# Patient Record
Sex: Female | Born: 1985 | Race: Black or African American | Hispanic: No | Marital: Single | Smoking: Current every day smoker
Health system: Southern US, Community
[De-identification: ages and names within clinical notes are randomized; demographics above are authoritative.]

## PROBLEM LIST (undated history)

## (undated) DIAGNOSIS — N871 Moderate cervical dysplasia: Secondary | ICD-10-CM

## (undated) HISTORY — DX: Moderate cervical dysplasia: N87.1

---

## 2012-02-20 ENCOUNTER — Encounter (HOSPITAL_BASED_OUTPATIENT_CLINIC_OR_DEPARTMENT_OTHER): Payer: Self-pay | Admitting: Emergency Medicine

## 2012-02-20 ENCOUNTER — Emergency Department (HOSPITAL_BASED_OUTPATIENT_CLINIC_OR_DEPARTMENT_OTHER)
Admission: EM | Admit: 2012-02-20 | Discharge: 2012-02-20 | Disposition: A | Discharge status: Home / Self Care | Payer: Self-pay | Attending: Emergency Medicine | Admitting: Emergency Medicine

## 2012-02-20 DIAGNOSIS — B373 Candidiasis of vulva and vagina: Secondary | ICD-10-CM | POA: Insufficient documentation

## 2012-02-20 DIAGNOSIS — B3731 Acute candidiasis of vulva and vagina: Secondary | ICD-10-CM | POA: Insufficient documentation

## 2012-02-20 DIAGNOSIS — N760 Acute vaginitis: Secondary | ICD-10-CM | POA: Insufficient documentation

## 2012-02-20 DIAGNOSIS — B9689 Other specified bacterial agents as the cause of diseases classified elsewhere: Secondary | ICD-10-CM

## 2012-02-20 DIAGNOSIS — F17200 Nicotine dependence, unspecified, uncomplicated: Secondary | ICD-10-CM | POA: Insufficient documentation

## 2012-02-20 DIAGNOSIS — N898 Other specified noninflammatory disorders of vagina: Secondary | ICD-10-CM | POA: Insufficient documentation

## 2012-02-20 LAB — WET PREP, GENITAL: Trich, Wet Prep: NONE SEEN

## 2012-02-20 LAB — URINALYSIS, ROUTINE W REFLEX MICROSCOPIC
Ketones, ur: 15 mg/dL — AB
Nitrite: NEGATIVE
Protein, ur: NEGATIVE mg/dL
pH: 6 (ref 5.0–8.0)

## 2012-02-20 LAB — URINE MICROSCOPIC-ADD ON

## 2012-02-20 MED ORDER — FLUCONAZOLE 150 MG PO TABS: 150.0000 mg | ORAL_TABLET | Freq: Once | ORAL | Status: DC

## 2012-02-20 MED ORDER — CEFTRIAXONE SODIUM 250 MG IJ SOLR
250.0000 mg | Freq: Once | INTRAMUSCULAR | Status: AC
  Administered 2012-02-20: 250 mg via INTRAMUSCULAR
  Filled 2012-02-20: qty 250

## 2012-02-20 MED ORDER — LIDOCAINE HCL (PF) 1 % IJ SOLN
INTRAMUSCULAR | Status: AC
  Administered 2012-02-20: 20:00:00
  Filled 2012-02-20: qty 5

## 2012-02-20 MED ORDER — AZITHROMYCIN 250 MG PO TABS
1000.0000 mg | ORAL_TABLET | Freq: Once | ORAL | Status: AC
  Administered 2012-02-20: 1000 mg via ORAL
  Filled 2012-02-20: qty 4

## 2012-02-20 MED ORDER — METRONIDAZOLE 500 MG PO TABS: 500.0000 mg | ORAL_TABLET | Freq: Two times a day (BID) | ORAL | Status: DC

## 2012-02-20 NOTE — ED Provider Notes (Signed)
Medical screening examination/treatment/procedure(s) were performed by non-physician practitioner and as supervising physician I was immediately available for consultation/collaboration.   Jackie Russman, MD 02/20/12 2312 

## 2012-02-20 NOTE — ED Provider Notes (Signed)
History     CSN: [id number]  Arrival date & time 02/20/12  7829   First MD Initiated Contact with Patient 02/20/12 1830      Chief Complaint  Patient presents with  . Vaginal Itching  . Vaginal Discharge    (Consider location/radiation/quality/duration/timing/severity/associated sxs/prior treatment) Patient is a 26 y.o. female presenting with vaginal discharge. The history is provided by the patient. No language interpreter was used.  Vaginal Discharge This is a new problem. The current episode started today. The problem occurs constantly. The problem has been gradually worsening. Nothing aggravates the symptoms. She has tried nothing for the symptoms. The treatment provided moderate relief.  Pt complains of a vaginal discharge.   Pt reports she had chlamydia a month ago  History reviewed. No pertinent past medical history.  History reviewed. No pertinent past surgical history.  No family history on file.  History  Substance Use Topics  . Smoking status: Current Every Day Smoker  . Smokeless tobacco: Not on file  . Alcohol Use: Yes     Comment: occ    OB History    Grav Para Term Preterm Abortions TAB SAB Ect Mult Living                  Review of Systems  Genitourinary: Positive for vaginal discharge.  All other systems reviewed and are negative.    Allergies  Review of patient's allergies indicates no known allergies.  Home Medications  No current outpatient prescriptions on file.  BP 143/97  Pulse 89  Temp 99.2 F (37.3 C) (Oral)  Resp 16  Ht 5\' 2"  (1.575 m)  Wt 120 lb (54.432 kg)  BMI 21.95 kg/m2  SpO2 100%  Physical Exam  Nursing note and vitals reviewed. Constitutional: She is oriented to person, place, and time. She appears well-developed and well-nourished.  HENT:  Head: Normocephalic.  Eyes: Pupils are equal, round, and reactive to light.  Neck: Normal range of motion.  Cardiovascular: Normal rate and normal heart sounds.     Pulmonary/Chest: Effort normal.  Abdominal: Soft.  Genitourinary: Vaginal discharge found.  Musculoskeletal: Normal range of motion.       Thick white vaginal discharge  Neurological: She is alert and oriented to person, place, and time. She has normal reflexes.  Skin: Skin is warm.  Psychiatric: She has a normal mood and affect.    ED Course  Procedures (including critical care time)  Labs Reviewed  URINALYSIS, ROUTINE W REFLEX MICROSCOPIC - Abnormal; Notable for the following:    Color, Urine AMBER (*)  BIOCHEMICALS MAY BE AFFECTED BY COLOR   APPearance CLOUDY (*)     Bilirubin Urine SMALL (*)     Ketones, ur 15 (*)     Leukocytes, UA MODERATE (*)     All other components within normal limits  URINE MICROSCOPIC-ADD ON - Abnormal; Notable for the following:    Squamous Epithelial / LPF MANY (*)     Bacteria, UA FEW (*)     All other components within normal limits  PREGNANCY, URINE  URINE CULTURE   No results found.   No diagnosis found.    MDM  Rocephin and zithromax given here.   Pt given rx for flagyl and diflucan.        Lonia Skinner [place], [place] 02/20/12 1956

## 2012-02-20 NOTE — ED Notes (Signed)
Vaginal itching and burning with white foamy discharge x1 week.

## 2012-02-21 ENCOUNTER — Encounter: Payer: Self-pay | Admitting: Obstetrics & Gynecology

## 2012-02-21 LAB — GC/CHLAMYDIA PROBE AMP
CT Probe RNA: NEGATIVE
GC Probe RNA: NEGATIVE

## 2012-02-22 LAB — URINE CULTURE: Colony Count: 30000

## 2012-04-16 ENCOUNTER — Encounter: Payer: Self-pay | Admitting: Obstetrics & Gynecology

## 2012-05-13 ENCOUNTER — Ambulatory Visit (INDEPENDENT_AMBULATORY_CARE_PROVIDER_SITE_OTHER): Payer: Medicaid Other | Admitting: Obstetrics & Gynecology

## 2012-05-13 ENCOUNTER — Other Ambulatory Visit (HOSPITAL_COMMUNITY)
Admission: RE | Admit: 2012-05-13 | Discharge: 2012-05-13 | Disposition: A | Discharge status: Home / Self Care | Payer: Medicaid Other | Source: Ambulatory Visit | Attending: Obstetrics & Gynecology | Admitting: Obstetrics & Gynecology

## 2012-05-13 ENCOUNTER — Encounter: Payer: Self-pay | Admitting: Obstetrics & Gynecology

## 2012-05-13 VITALS — BP 117/80 | HR 99 | Temp 99.5°F | Ht 62.0 in | Wt 125.7 lb

## 2012-05-13 DIAGNOSIS — R87612 Low grade squamous intraepithelial lesion on cytologic smear of cervix (LGSIL): Secondary | ICD-10-CM

## 2012-05-13 DIAGNOSIS — R87619 Unspecified abnormal cytological findings in specimens from cervix uteri: Secondary | ICD-10-CM | POA: Insufficient documentation

## 2012-05-13 NOTE — Patient Instructions (Addendum)
Loop Electrosurgical Excision Procedure Loop electrosurgical excision procedure (LEEP) is the removal of a portion of the lower part of the uterus (cervix). The procedure is done when there are significantly abnormal cervical cell changes. Abnormal cell changes of the cervix can lead to cancer if left in place and untreated.  The LEEP procedure itself typically only takes a few minutes. Often, it may be done in your caregiver's office. The procedure is considered safe for those who wish to get pregnant or are trying to get pregnant. Only under rare circumstances should this procedure be done if you are pregnant. LET YOUR CAREGIVER KNOW ABOUT:  Whether you are pregnant or late for your last menstrual period.  Allergies to foods or medicines.  All the medicines you are taking includingherbs, eyedrops, and over-the-counter medicines, and creams.  Use of steroids (by mouth or creams).  Previous problems with anesthetics or numbing medicine.  Previous gynecological surgery.  History of blood clots or bleeding problems.  Any recent or current vaginal infections (herpes, sexually transmitted infections).  Other health problems. RISKS AND COMPLICATIONS  Bleeding.  Infection.  Injury to the vagina, bladder, or rectum.  Very rare obstruction of the cervical opening that causes problems during menstruation (cervical stenosis). BEFORE THE PROCEDURE  Do not take aspirin or blood thinners (anticoagulants) for 1 week before the procedure, or as told by your caregiver.  Eat a light meal before the procedure.  Ask your caregiver about changing or stopping your regular medicines.  You may be given a pain reliever 1 or 2 hours before the procedure. PROCEDURE   A tool (speculum) is placed in the vagina. This allows your caregiver to see the cervix.  An iodine stain is applied to the cervix to find the area of abnormal cells to be removed.  Medicine is injected to numb the cervix (local  anesthetic).   Electricity is passed through a thin wire loop which is then used to remove (cauterize) a small segment of the affected cervix.  Light electrocautery is used to seal any small blood vessels and prevent bleeding.  A paste may be applied to the cauterized area of the cervix to help prevent bleeding.  The tissue sample is sent to the lab. It is examined under the microscope. AFTER THE PROCEDURE  Have someone drive you home.  You may have slight to moderate cramping.  You may notice a black vaginal discharge from the paste used on the cervix to prevent bleeding. This is normal.  Watch for excessive bleeding. This requires immediate medical care.  Ask when your test results will be ready. Make sure you get your test results. Document Released: 06/15/2002 Document Revised: 06/17/2011 Document Reviewed: 09/04/2010 [facility] Patient Information 2013 [place], [place]. Cervical Dysplasia Cervical dysplasia is a condition in which a woman has abnormal changes in the cells of her cervix. The cervix is the opening to the uterus (womb) between the vagina and the uterus. These changes are called cervical dysplasia and may be the first signs of cervical cancer. These cells can be taken from the cervix during a Pap test and then looked at under a microscope. With early detection, treatment, and close follow-up care, nearly all cervical dysplasia can be cured. If untreated, the mild to moderate stages of dysplasia often grow more severe.  RISK FACTORS  The following increase the risk for cervical dysplasia.  Having had a sexually transmitted disease, including:  Chlamydia.  Human papilloma virus (HPV).  Becoming sexually active before age 66.  Having had more than 1 sexual partner.  Not using protection, such as condoms, during sexual intercourse, especially with new sexual partners.  Having had cancer of the vagina or vulva.  Having a sexual partner whose previous partner had  cancer of the cervix or cervical dysplasia.  Having a sexual partner who has or has had cancer of the penis.  Having a weakened immune system (HIV, organ transplant).  Being the daughter of a woman who took DES (diethylstilbestrol) during pregnancy.  A history of cervical cancer in a woman's sister or mother.  Smoking.  Having had an abnormal Pap test in the past. SYMPTOMS  There are usually no symptoms. If there are symptoms, they may be vague such as:  Abnormal vaginal discharge.  Bleeding between periods or following intercourse.  Bleeding during menopause.  Pain on intercourse (dyspareunia). DIAGNOSIS   The Pap test is the best way of detecting abnormalities of the cervix.  Biopsy (removing a piece of tissue to look at under the microscope) of the cervix when the Pap test is abnormal or when the Pap test is normal, but the cervix looks abnormal. TREATMENT  Catching and treating the changes early with Pap tests can prevent cervical cancer.  Cryotherapy freezes the abnormal cells with a steel tip instrument.  A laser can be used to remove the abnormal cells.  Loop electrocautery excision procedure (LEEP). This procedure uses a heated electrical loop to remove a cone-like portion of the cervix, including the cervical canal.  For more serious cases of cervical dysplasia, the abnormal tissue may be removed surgically by:  A cone biopsy (by cold knife, laser or LEEP). A procedure in which a portion of the center of the cervix with the cervical canal is removed.  The uterus and cervix are removed (hysterectomy). Your caregiver will advise you regarding the need and timing of Pap tests in your follow-up. Women who have been treated for dysplasia should be closely followed with pelvic exams and Pap tests. During the first year following treatment of cervical dysplasia, Pap tests should be done every 3 to 4 months. In the second year, the schedule is every 6 months, or as  recommended by your caregiver. See your caregiver for new or worsening problems. HOME CARE INSTRUCTIONS   Follow the instructions and recommendations of your caregiver regarding medicines and follow-up appointments.  Only take over-the-counter or prescription medicines for pain or discomfort as directed by your caregiver.  Cramping and pelvic discomfort may follow cryotherapy. It is not abnormal to have watery discharge for several weeks after.  Laser, cone surgery, cryotherapy or LEEP can cause a bad smelling vaginal discharge. It may also cause vaginal bleeding for a couple weeks following the procedure. The discharge may be black from the paste used to control bleeding from the cone site. This is normal.  Do not use tampons, have sexual intercourse or douche until your caregiver says it is okay. SEEK MEDICAL CARE IF:   You develop genital warts.  You need a prescription for pain medicine following your treatment. SEEK IMMEDIATE MEDICAL CARE IF:   Your bleeding is heavier than a normal menstrual period.  You develop bright red bleeding, especially if you have blood clots.  You have a fever.  You have increasing cramps or pain not relieved with medicine.  You are lightheaded, unusually weak, or have fainting spells.  You have abnormal vaginal discharge.  You develop abdominal pain. PREVENTION   The surest way to prevent cervical dysplasia is  to abstain from sexual intercourse.  Practice safe sex, use condoms and have only one sex partner who does not have other sex partners.  A Pap test is done to screen for cervical cancer.  The first Pap test should be done at age 54.  Between ages 70 and 34, Pap tests are repeated every 2 years.  Beginning at age 43, you are advised to have a Pap test every 3 years as long as your past 3 Pap tests have been normal.  Some women have medical problems that increase the chance of getting cervical cancer. Talk to your caregiver about  these problems. It is especially important to talk to your caregiver if a new problem develops soon after your last Pap test. In these cases, your caregiver may recommend more frequent screening and Pap tests.  The above recommendations are the same for women who have or have not gotten the vaccine for HPV (Human Papillomavirus).  If you had a hysterectomy for a problem that was not a cancer or a condition that could lead to cancer, then you no longer need Pap tests. However, even if you no longer need a Pap test, a regular exam is a good idea to make sure no other problems are starting.   If you are between ages 59 and 31, and you have had normal Pap tests going back 10 years, you no longer need Pap tests. However, even if you no longer need a Pap test, a regular exam is a good idea to make sure no other problems are starting.   If you have had past treatment for cervical cancer or a condition that could lead to cancer, you need Pap tests and screening for cancer for at least 20 years after your treatment.  If Pap tests have been discontinued, risk factors (such as a new sexual partner) need to be re-assessed to determine if screening should be resumed.  Some women may need screenings more often if they are at high risk for cervical cancer.  Your caregiver may do additional tests including:  Colposcopy. A procedure in which a special microscope magnifies the cells and allows the provider to closely examine the cervix, vagina, and vulva.  Biopsy. A small tissue sample is taken from the cervix, vagina or vulva. This is generally done in your caregivers office.  A cone biopsy (cold knife or laser). A large tissue sample is taken from the cervix. This procedure is usually done in an operating room under a general anesthetic. The cone often removes all abnormal tissue and so may also complete the treatment.  LEEP, also removing a circular portion of the cervix and is done in a doctors office  under a local anesthetic.  Now there is a vaccine, Gardasil, that was developed to prevent the HPV'S that can cause cancer of the cervix and genital warts. It is recommended for females ages 14 to 42. It should not be given to pregnant women until more is known about its effects on the fetus. Not all cancers of the cervix are caused by the HPV. Routine gynecology exams and Pap tests should continue as recommended by your caregiver. Document Released: 03/25/2005 Document Revised: 06/17/2011 Document Reviewed: 03/16/2008 [facility] Patient Information 2013 [place], [place].

## 2012-05-13 NOTE — Addendum Note (Signed)
Addended by: Gerome Apley on: 05/13/2012 02:49 PM   Modules accepted: Orders

## 2012-05-13 NOTE — Progress Notes (Signed)
Patient ID: Wanda Haynes, female   DOB: 04/18/1985, 27 y.o.   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  Here for colposcopy. Patient given informed consent, signed copy in the chart, time out was performed.  Placed in lithotomy position. Cervix viewed with speculum and colposcope after application of acetic acid.   Colposcopy adequate? Yes Acetowhite lesions?yes at  6:00 and 12-1:00 Punctation?yes Mosaicism?  no Abnormal vasculature? no Biopsies?yes.  x2 at 6:00 and 1:00 ECC?no  Patient was given post procedure instructions.  She will return in 4 weeks for results.    Quintasha Gren L. Harraway-Smith, M.D., [place]

## 2012-05-26 ENCOUNTER — Telehealth: Payer: Self-pay

## 2012-05-26 ENCOUNTER — Encounter: Payer: Self-pay | Admitting: *Deleted

## 2012-05-26 NOTE — Telephone Encounter (Signed)
Called pt and left message that we are calling to verify her appt scheduled on 06/15/12.   Re:  Need to make sure that appt is aware that her appt is for LEEP and not a follow up.

## 2012-05-26 NOTE — Telephone Encounter (Signed)
Message copied by Faythe Casa on Tue May 26, 2012  2:10 PM ------      Message from: Odelia Gage A      Created: Mon May 25, 2012  1:43 PM         Pt has appt on 03/05 to see harraway-smith. However, I have made an appt for 03/10 for her leep. Please advise.      ----- Message -----         From: Drucilla Schmidt Day, RN         Sent: 05/19/2012   8:58 AM           To: [facility] Admin Pool            Please schedule pt and then send back to clinical pool for [place] to notify pt. Thanks       ----- Message -----         From: Willodean Rosenthal, MD         Sent: 05/19/2012   8:51 AM           To: [facility] Clinical Pool            Please call pt.  Needs LEEP.  Please assist with scheduling.            Thx,      clh-S             ------

## 2012-05-27 NOTE — Telephone Encounter (Signed)
Called patient and informed her of appt on 3/5, patient states she is aware of this appt already because it was for the 3/10 but she had it changed. Patient had no further questions.

## 2012-06-10 ENCOUNTER — Encounter: Payer: Self-pay | Admitting: Obstetrics & Gynecology

## 2012-06-10 ENCOUNTER — Ambulatory Visit (INDEPENDENT_AMBULATORY_CARE_PROVIDER_SITE_OTHER): Payer: Medicaid Other | Admitting: Obstetrics & Gynecology

## 2012-06-10 ENCOUNTER — Other Ambulatory Visit (HOSPITAL_COMMUNITY)
Admission: RE | Admit: 2012-06-10 | Discharge: 2012-06-10 | Disposition: A | Discharge status: Home / Self Care | Payer: Medicaid Other | Source: Ambulatory Visit | Attending: Obstetrics & Gynecology | Admitting: Obstetrics & Gynecology

## 2012-06-10 ENCOUNTER — Other Ambulatory Visit: Payer: Self-pay | Admitting: Obstetrics & Gynecology

## 2012-06-10 ENCOUNTER — Ambulatory Visit: Payer: Medicaid Other | Admitting: Obstetrics & Gynecology

## 2012-06-10 VITALS — BP 131/88 | HR 106 | Temp 100.1°F | Resp 20 | Ht 62.0 in | Wt 126.1 lb

## 2012-06-10 DIAGNOSIS — N871 Moderate cervical dysplasia: Secondary | ICD-10-CM

## 2012-06-10 DIAGNOSIS — Z01812 Encounter for preprocedural laboratory examination: Secondary | ICD-10-CM

## 2012-06-10 NOTE — Progress Notes (Signed)
Patient ID: Wanda Haynes, female   DOB: [birth year], 27 y.o.   MRN: [id number] Pap smear and colposcopy reviewed.   Pap 01/29/12 LGSIL prev PAP ASCUS  Colpo Biopsy: Diagnosis 1. 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 - SEE COMMENT. 2. Cervix, biopsy, 2 o'clock - TRANSFORMATION ZONE MUCOSA WITH MILD SQUAMOUS DYSPLASIA (CIN-I). - ASSOCIATED ACUTE AND CHRONIC CERVICITIS  Risks, benefits, alternatives, and limitations of procedure explained to patient, including pain, bleeding, infection, failure to remove abnormal tissue and failure to cure dysplasia, need for repeat procedures, damage to pelvic organs, cervical incompetence.  Role of HPV,cervical dysplasia and need for close followup was empasized. Informed written consent was obtained. All questions were answered. Time out performed.  Procedure: The patient was placed in lithotomy position and the bivalved coated speculum was placed in the patient's vagina. A grounding pad placed on the patient. Local anesthesia was administered via an intracervical block using 10cc of 2% Lidocaine with epinephrine. The suction was turned on and the Medium 1X Fisher Cone Biopsy Excisor on 30 Watts of cutting current was used to excise the area of decreased uptake and excise the entire transformation zone. Excellent hemostasis was achieved using roller ball coagulation set at 80 Watts coagulation current. Monsel's solution was then applied and excellent hemostasis was noted.  The speculum was removed from the vagina. Specimens were sent to pathology. The patient tolerated the procedure well. Post-operative instructions given to patient, including instruction to seek medical attention for persistent bright red bleeding, fever, abdominal/pelvic pain, dysuria, nausea or vomiting. She was also told about the possibility of having copious yellow to black tinged discharge. She was  counseled to avoid anything in the vagina (sex/douching/tampons) for 4 weeks. She has a  4 week post-operative check to review results and assess wound healing. Follow up in 4 months for repeat pap or as needed.

## 2012-06-10 NOTE — Addendum Note (Signed)
Addended by: Sherre Lain A on: 06/10/2012 03:02 PM   Modules accepted: Orders

## 2012-06-10 NOTE — Patient Instructions (Signed)
Loop Electrosurgical Excision Procedure  Care After  Refer to this sheet in the next few weeks. These instructions provide you with information on caring for yourself after your procedure. Your caregiver may also give you more specific instructions. Your treatment has been planned according to current medical practices, but problems sometimes occur. Call your caregiver if you have any problems or questions after your procedure.  HOME CARE INSTRUCTIONS   · Do not use tampons, douche, or have sexual intercourse for 2 weeks or as directed by your caregiver.  · Begin normal activities if you have no or minimal cramping or bleeding, unless directed otherwise by your caregiver.  · Take your temperature if you feel sick. Write down your temperature on paper, and tell your caregiver if you have a fever.  · Take all medicines as directed by your caregiver.  · Keep all your follow-up appointments and Pap tests as directed by your caregiver.  SEEK IMMEDIATE MEDICAL CARE IF:   · You have bleeding that is heavier or longer than a normal menstrual cycle.  · You have bleeding that is bright red.  · You have blood clots.  · You have a fever.  · You have increasing cramps or pain not relieved by medicine.  · You develop abdominal pain that does not seem to be related to the same area of earlier cramping and pain.  · You are lightheaded, unusually weak, or faint.  · You develop painful or bloody urination.  · You develop a bad smelling vaginal discharge.  MAKE SURE YOU:  · Understand these instructions.  · Will watch your condition.  · Will get help right away if you are not doing well or get worse.  Document Released: 12/06/2010 Document Revised: 06/17/2011 Document Reviewed: 12/06/2010  [employer]® Patient Information ©2013 [employer].

## 2012-06-15 ENCOUNTER — Encounter: Payer: Medicaid Other | Admitting: Obstetrics & Gynecology

## 2012-07-08 ENCOUNTER — Ambulatory Visit (INDEPENDENT_AMBULATORY_CARE_PROVIDER_SITE_OTHER): Payer: Medicaid Other | Admitting: Obstetrics & Gynecology

## 2012-07-08 ENCOUNTER — Encounter: Payer: Self-pay | Admitting: Obstetrics & Gynecology

## 2012-07-08 VITALS — BP 120/89 | HR 95 | Temp 100.0°F | Wt 125.0 lb

## 2012-07-08 DIAGNOSIS — N879 Dysplasia of cervix uteri, unspecified: Secondary | ICD-10-CM

## 2012-07-08 NOTE — Patient Instructions (Signed)
Cervical Dysplasia Cervical dysplasia is a condition in which a woman has abnormal changes in the cells of her cervix. The cervix is the opening to the uterus (womb) between the vagina and the uterus. These changes are called cervical dysplasia and may be the first signs of cervical cancer. These cells can be taken from the cervix during a Pap test and then looked at under a microscope. With early detection, treatment, and close follow-up care, nearly all cervical dysplasia can be cured. If untreated, the mild to moderate stages of dysplasia often grow more severe.  RISK FACTORS  The following increase the risk for cervical dysplasia.  Having had a sexually transmitted disease, including:  Chlamydia.  Human papilloma virus (HPV).  Becoming sexually active before age 18.  Having had more than 1 sexual partner.  Not using protection, such as condoms, during sexual intercourse, especially with new sexual partners.  Having had cancer of the vagina or vulva.  Having a sexual partner whose previous partner had cancer of the cervix or cervical dysplasia.  Having a sexual partner who has or has had cancer of the penis.  Having a weakened immune system (HIV, organ transplant).  Being the daughter of a woman who took DES (diethylstilbestrol) during pregnancy.  A history of cervical cancer in a woman's sister or mother.  Smoking.  Having had an abnormal Pap test in the past. SYMPTOMS  There are usually no symptoms. If there are symptoms, they may be vague such as:  Abnormal vaginal discharge.  Bleeding between periods or following intercourse.  Bleeding during menopause.  Pain on intercourse (dyspareunia). DIAGNOSIS   The Pap test is the best way of detecting abnormalities of the cervix.  Biopsy (removing a piece of tissue to look at under the microscope) of the cervix when the Pap test is abnormal or when the Pap test is normal, but the cervix looks abnormal. TREATMENT    Catching and treating the changes early with Pap tests can prevent cervical cancer.  Cryotherapy freezes the abnormal cells with a steel tip instrument.  A laser can be used to remove the abnormal cells.  Loop electrocautery excision procedure (LEEP). This procedure uses a heated electrical loop to remove a cone-like portion of the cervix, including the cervical canal.  For more serious cases of cervical dysplasia, the abnormal tissue may be removed surgically by:  A cone biopsy (by cold knife, laser or LEEP). A procedure in which a portion of the center of the cervix with the cervical canal is removed.  The uterus and cervix are removed (hysterectomy). Your caregiver will advise you regarding the need and timing of Pap tests in your follow-up. Women who have been treated for dysplasia should be closely followed with pelvic exams and Pap tests. During the first year following treatment of cervical dysplasia, Pap tests should be done every 3 to 4 months. In the second year, the schedule is every 6 months, or as recommended by your caregiver. See your caregiver for new or worsening problems. HOME CARE INSTRUCTIONS   Follow the instructions and recommendations of your caregiver regarding medicines and follow-up appointments.  Only take over-the-counter or prescription medicines for pain or discomfort as directed by your caregiver.  Cramping and pelvic discomfort may follow cryotherapy. It is not abnormal to have watery discharge for several weeks after.  Laser, cone surgery, cryotherapy or LEEP can cause a bad smelling vaginal discharge. It may also cause vaginal bleeding for a couple weeks following the procedure. The   discharge may be black from the paste used to control bleeding from the cone site. This is normal.  Do not use tampons, have sexual intercourse or douche until your caregiver says it is okay. SEEK MEDICAL CARE IF:   You develop genital warts.  You need a prescription for  pain medicine following your treatment. SEEK IMMEDIATE MEDICAL CARE IF:   Your bleeding is heavier than a normal menstrual period.  You develop bright red bleeding, especially if you have blood clots.  You have a fever.  You have increasing cramps or pain not relieved with medicine.  You are lightheaded, unusually weak, or have fainting spells.  You have abnormal vaginal discharge.  You develop abdominal pain. PREVENTION   The surest way to prevent cervical dysplasia is to abstain from sexual intercourse.  Practice safe sex, use condoms and have only one sex partner who does not have other sex partners.  A Pap test is done to screen for cervical cancer.  The first Pap test should be done at age 21.  Between ages 21 and 29, Pap tests are repeated every 2 years.  Beginning at age 30, you are advised to have a Pap test every 3 years as long as your past 3 Pap tests have been normal.  Some women have medical problems that increase the chance of getting cervical cancer. Talk to your caregiver about these problems. It is especially important to talk to your caregiver if a new problem develops soon after your last Pap test. In these cases, your caregiver may recommend more frequent screening and Pap tests.  The above recommendations are the same for women who have or have not gotten the vaccine for HPV (Human Papillomavirus).  If you had a hysterectomy for a problem that was not a cancer or a condition that could lead to cancer, then you no longer need Pap tests. However, even if you no longer need a Pap test, a regular exam is a good idea to make sure no other problems are starting.   If you are between ages 65 and 70, and you have had normal Pap tests going back 10 years, you no longer need Pap tests. However, even if you no longer need a Pap test, a regular exam is a good idea to make sure no other problems are starting.   If you have had past treatment for cervical cancer or a  condition that could lead to cancer, you need Pap tests and screening for cancer for at least 20 years after your treatment.  If Pap tests have been discontinued, risk factors (such as a new sexual partner) need to be re-assessed to determine if screening should be resumed.  Some women may need screenings more often if they are at high risk for cervical cancer.  Your caregiver may do additional tests including:  Colposcopy. A procedure in which a special microscope magnifies the cells and allows the provider to closely examine the cervix, vagina, and vulva.  Biopsy. A small tissue sample is taken from the cervix, vagina or vulva. This is generally done in your caregivers office.  A cone biopsy (cold knife or laser). A large tissue sample is taken from the cervix. This procedure is usually done in an operating room under a general anesthetic. The cone often removes all abnormal tissue and so may also complete the treatment.  LEEP, also removing a circular portion of the cervix and is done in a doctors office under a local anesthetic.  Now   there is a vaccine, Gardasil, that was developed to prevent the HPV'S that can cause cancer of the cervix and genital warts. It is recommended for females ages 9 to 26. It should not be given to pregnant women until more is known about its effects on the fetus. Not all cancers of the cervix are caused by the HPV. Routine gynecology exams and Pap tests should continue as recommended by your caregiver. Document Released: 03/25/2005 Document Revised: 06/17/2011 Document Reviewed: 03/16/2008 Exit[employer] Patient Information 2013 [employer].  

## 2012-07-08 NOTE — Progress Notes (Signed)
Subjective:     Patient ID: Wanda Haynes, female   DOB: Dec 12, 1985, 27 y.o.   MRN: [id number]  HPI pt s/p LEEP 06/10/2012 she denies problems    Review of Systems     Objective:   Physical Exam BP 120/89  Pulse 95  Temp(Src) 100 F (37.8 C) (Oral)  Wt 125 lb (56.7 kg)  BMI 22.86 kg/m2 GU: EGBUS: no lesions Vagina: no blood in vault Cervix: no lesion; well healed   06/10/2012  Cervix, LEEP - HIGH GRADE SQUAMOUS INTRAEPITHELIAL LESION, CIN-II (MODERATE DYSPLASIA). - MARGINS NOT INVOLVED.     Assessment:     Cervical dysplasia- s/p LEEP     Plan:     F/u 6 months or sooner prn

## 2012-07-22 ENCOUNTER — Encounter: Payer: Self-pay | Admitting: Family Medicine

## 2012-07-22 ENCOUNTER — Ambulatory Visit (INDEPENDENT_AMBULATORY_CARE_PROVIDER_SITE_OTHER): Payer: Medicaid Other | Admitting: Family Medicine

## 2012-07-22 VITALS — BP 127/90 | HR 94 | Temp 98.6°F | Ht 62.0 in | Wt 123.7 lb

## 2012-07-22 DIAGNOSIS — N898 Other specified noninflammatory disorders of vagina: Secondary | ICD-10-CM

## 2012-07-22 MED ORDER — FLUCONAZOLE 150 MG PO TABS: 150.0000 mg | ORAL_TABLET | Freq: Once | ORAL | Status: DC

## 2012-07-22 NOTE — Progress Notes (Signed)
Patient ID: Wanda Haynes, female   DOB: [birth year], 27 y.o.   MRN: [id number]   S:  Vulvar and vaginal itching and white discharge with some brown. Sore/irritated. No new soaps/detergents.  Would like testing for gonorrhea/chlamydia.   O:   Filed Vitals:   07/22/12 1423  BP: 127/90  Pulse: 94  Temp: 98.6 F (37 C)   GEN: WNWD, no distress ABD:  Soft, non-tender, non-distended, normal bowel sounds. GU:  Vulvar and vaginal erythema, thick white discharge. Normal cervix, no CMT or adnexal tenderness.   A/P 27 y.o. female with vulvo-vaginitis. - Diflucan - Wet prep/GC/chlamydia sent.  Napoleon Form, MD

## 2012-07-22 NOTE — Patient Instructions (Signed)
Monilial Vaginitis Vaginitis in a soreness, swelling and redness (inflammation) of the vagina and vulva. Monilial vaginitis is not a sexually transmitted infection. CAUSES  Yeast vaginitis is caused by yeast (candida) that is normally found in your vagina. With a yeast infection, the candida has overgrown in number to a point that upsets the chemical balance. SYMPTOMS   White, thick vaginal discharge.  Swelling, itching, redness and irritation of the vagina and possibly the lips of the vagina (vulva).  Burning or painful urination.  Painful intercourse. DIAGNOSIS  Things that may contribute to monilial vaginitis are:  Postmenopausal and virginal states.  Pregnancy.  Infections.  Being tired, sick or stressed, especially if you had monilial vaginitis in the past.  Diabetes. Good control will help lower the chance.  Birth control pills.  Tight fitting garments.  Using bubble bath, feminine sprays, douches or deodorant tampons.  Taking certain medications that kill germs (antibiotics).  Sporadic recurrence can occur if you become ill. TREATMENT  Your caregiver will give you medication.  There are several kinds of anti monilial vaginal creams and suppositories specific for monilial vaginitis. For recurrent yeast infections, use a suppository or cream in the vagina 2 times a week, or as directed.  Anti-monilial or steroid cream for the itching or irritation of the vulva may also be used. Get your caregiver's permission.  Painting the vagina with methylene blue solution may help if the monilial cream does not work.  Eating yogurt may help prevent monilial vaginitis. HOME CARE INSTRUCTIONS   Finish all medication as prescribed.  Do not have sex until treatment is completed or after your caregiver tells you it is okay.  Take warm sitz baths.  Do not douche.  Do not use tampons, especially scented ones.  Wear cotton underwear.  Avoid tight pants and panty  hose.  Tell your sexual partner that you have a yeast infection. They should go to their caregiver if they have symptoms such as mild rash or itching.  Your sexual partner should be treated as well if your infection is difficult to eliminate.  Practice safer sex. Use condoms.  Some vaginal medications cause latex condoms to fail. Vaginal medications that harm condoms are:  Cleocin cream.  Butoconazole (Femstat).  Terconazole (Terazol) vaginal suppository.  Miconazole (Monistat) (may be purchased over the counter). SEEK MEDICAL CARE IF:   You have a temperature by mouth above 102 F (38.9 C).  The infection is getting worse after 2 days of treatment.  The infection is not getting better after 3 days of treatment.  You develop blisters in or around your vagina.  You develop vaginal bleeding, and it is not your menstrual period.  You have pain when you urinate.  You develop intestinal problems.  You have pain with sexual intercourse. Document Released: 01/02/2005 Document Revised: 06/17/2011 Document Reviewed: 09/16/2008 Exit[employer] Patient Information 2013 [employer].  

## 2012-07-23 LAB — WET PREP, GENITAL: Yeast Wet Prep HPF POC: NONE SEEN

## 2012-07-25 LAB — GC/CHLAMYDIA PROBE AMP
CT Probe RNA: NEGATIVE
GC Probe RNA: NEGATIVE

## 2012-07-26 ENCOUNTER — Other Ambulatory Visit: Payer: Self-pay | Admitting: Family Medicine

## 2012-07-26 MED ORDER — METRONIDAZOLE 500 MG PO TABS: 500.0000 mg | ORAL_TABLET | Freq: Two times a day (BID) | ORAL | Status: DC

## 2012-07-27 ENCOUNTER — Telehealth: Payer: Self-pay | Admitting: Obstetrics and Gynecology

## 2012-07-27 NOTE — Telephone Encounter (Addendum)
Message copied by Toula Moos on Mon Jul 27, 2012  4:32 PM   ------Called patient and notified of result and Rx as stated below. Patient agrees and satisfied.        Message from: FERRY, [place]      Created: Sun Jul 26, 2012 11:55 AM       Pt needs to be treated for trichimonas and BV. Rx sent for flagyl 500 bid for 7 days. Partner needs to be checked and treated too. Please let pt know. Thanks. GC/chlamydia negative. ------

## 2012-07-28 ENCOUNTER — Other Ambulatory Visit: Payer: Self-pay

## 2012-07-28 MED ORDER — METRONIDAZOLE 500 MG PO TABS: 500.0000 mg | ORAL_TABLET | Freq: Two times a day (BID) | ORAL | Status: AC

## 2013-03-29 ENCOUNTER — Encounter (HOSPITAL_BASED_OUTPATIENT_CLINIC_OR_DEPARTMENT_OTHER): Payer: Self-pay | Admitting: Emergency Medicine

## 2013-03-29 ENCOUNTER — Emergency Department (HOSPITAL_BASED_OUTPATIENT_CLINIC_OR_DEPARTMENT_OTHER)
Admission: EM | Admit: 2013-03-29 | Discharge: 2013-03-29 | Disposition: A | Discharge status: Home / Self Care | Payer: Medicaid Other | Attending: Emergency Medicine | Admitting: Emergency Medicine

## 2013-03-29 DIAGNOSIS — B9689 Other specified bacterial agents as the cause of diseases classified elsewhere: Secondary | ICD-10-CM | POA: Insufficient documentation

## 2013-03-29 DIAGNOSIS — N760 Acute vaginitis: Secondary | ICD-10-CM | POA: Insufficient documentation

## 2013-03-29 DIAGNOSIS — A499 Bacterial infection, unspecified: Secondary | ICD-10-CM | POA: Insufficient documentation

## 2013-03-29 DIAGNOSIS — F17200 Nicotine dependence, unspecified, uncomplicated: Secondary | ICD-10-CM | POA: Insufficient documentation

## 2013-03-29 DIAGNOSIS — Z3202 Encounter for pregnancy test, result negative: Secondary | ICD-10-CM | POA: Insufficient documentation

## 2013-03-29 LAB — URINE MICROSCOPIC-ADD ON

## 2013-03-29 LAB — URINALYSIS, ROUTINE W REFLEX MICROSCOPIC
Glucose, UA: NEGATIVE mg/dL
Ketones, ur: NEGATIVE mg/dL
Protein, ur: NEGATIVE mg/dL
Urobilinogen, UA: 1 mg/dL (ref 0.0–1.0)

## 2013-03-29 LAB — PREGNANCY, URINE: Preg Test, Ur: NEGATIVE

## 2013-03-29 LAB — WET PREP, GENITAL: Trich, Wet Prep: NONE SEEN

## 2013-03-29 MED ORDER — METRONIDAZOLE 500 MG PO TABS: 500.0000 mg | ORAL_TABLET | Freq: Two times a day (BID) | ORAL | Status: DC

## 2013-03-29 MED ORDER — AZITHROMYCIN 250 MG PO TABS
1000.0000 mg | ORAL_TABLET | Freq: Once | ORAL | Status: AC
  Administered 2013-03-29: 1000 mg via ORAL
  Filled 2013-03-29: qty 4

## 2013-03-29 MED ORDER — CEFTRIAXONE SODIUM 250 MG IJ SOLR
250.0000 mg | Freq: Once | INTRAMUSCULAR | Status: AC
  Administered 2013-03-29: 250 mg via INTRAMUSCULAR
  Filled 2013-03-29: qty 250

## 2013-03-29 NOTE — ED Provider Notes (Signed)
Medical screening examination/treatment/procedure(s) were performed by non-physician practitioner and as supervising physician I was immediately available for consultation/collaboration.  EKG Interpretation   None         Candyce Churn, MD 03/29/13 1053

## 2013-03-29 NOTE — ED Notes (Signed)
Pt reports onset of vaginal d/c, vaginal itching and irritation 4 days ago.

## 2013-03-29 NOTE — ED Provider Notes (Signed)
CSN: [id number]     Arrival date & time 03/29/13  4098 History   First MD Initiated Contact with Patient 03/29/13 0901     Chief Complaint  Patient presents with  . Vaginal Discharge  . Vaginal Itching   (Consider location/radiation/quality/duration/timing/severity/associated sxs/prior Treatment) The history is provided by the patient and medical records.   This is a 27 y.o. F with no significant PMH presenting to the ED for vaginal discharge, itching, and irritation x4 days.  Discharged described as thick, white, curd-like without associated odor.  Pt admits to new sexual partner and has some concern for possible STD.  Denies prior hx of STD. Denies possibility of pregnancy, currently on Depo.  Denies fevers, sweats, or chills.  No abdominal pain, nausea, vomiting, diarrhea, or urinary sx.  Past Medical History  Diagnosis Date  . CIN II (cervical intraepithelial neoplasia II)    History reviewed. No pertinent past surgical history. No family history on file. History  Substance Use Topics  . Smoking status: Current Every Day Smoker -- 0.25 packs/day for 4 years    Types: Cigarettes  . Smokeless tobacco: Never Used  . Alcohol Use: Yes     Comment: occ   OB History   Grav Para Term Preterm Abortions TAB SAB Ect Mult Living   1 1 1  0 0 0 0 0 0 1     Review of Systems  Genitourinary: Positive for vaginal discharge.  All other systems reviewed and are negative.    Allergies  Review of patient's allergies indicates no known allergies.  Home Medications  No current outpatient prescriptions on file. BP 121/84  Pulse 119  Temp(Src) 98.5 F (36.9 C) (Oral)  Resp 16  Ht 5\' 2"  (1.575 m)  Wt 119 lb (53.978 kg)  BMI 21.76 kg/m2  SpO2 100%  Physical Exam  Nursing note and vitals reviewed. Constitutional: She is oriented to person, place, and time. She appears well-developed and well-nourished. No distress.  HENT:  Head: Normocephalic and atraumatic.  Eyes: Conjunctivae and  EOM are normal. Pupils are equal, round, and reactive to light.  Neck: Normal range of motion. Neck supple.  Cardiovascular: Normal rate, regular rhythm and normal heart sounds.   Pulmonary/Chest: Effort normal and breath sounds normal. No respiratory distress. She has no wheezes.  Abdominal: Soft. Bowel sounds are normal. There is no tenderness. There is no guarding.  Genitourinary: There is no tenderness or lesion on the right labia. There is no tenderness or lesion on the left labia. Cervix exhibits no motion tenderness. Right adnexum displays no mass and no tenderness. Left adnexum displays no mass and no tenderness. No erythema, tenderness or bleeding around the vagina. Vaginal discharge found.  Thick, white vaginal discharge present; no bleeding; no adnexal or CMT  Musculoskeletal: Normal range of motion.  Neurological: She is alert and oriented to person, place, and time.  Skin: Skin is warm and dry. She is not diaphoretic.  Psychiatric: She has a normal mood and affect.    ED Course  Procedures (including critical care time) Labs Review Labs Reviewed  WET PREP, GENITAL - Abnormal; Notable for the following:    Clue Cells Wet Prep HPF POC FEW (*)    WBC, Wet Prep HPF POC MANY (*)    All other components within normal limits  URINALYSIS, ROUTINE W REFLEX MICROSCOPIC - Abnormal; Notable for the following:    APPearance CLOUDY (*)    Bilirubin Urine SMALL (*)    Leukocytes, UA MODERATE (*)  All other components within normal limits  URINE MICROSCOPIC-ADD ON - Abnormal; Notable for the following:    Squamous Epithelial / LPF FEW (*)    Bacteria, UA MANY (*)    All other components within normal limits  GC/CHLAMYDIA PROBE AMP  URINE CULTURE  PREGNANCY, URINE   Imaging Review No results found.  EKG Interpretation   None       MDM   1. Bacterial vaginosis    u-preg negative.  U/a with many bacteria, however asx-- culture pending.  Wet prep with few clue cells-- will  start on course of flagyl, advised against consuming EtOH while taking this medication.  Will tx ppx with azithromycin and rocephin in the ED, advised she will be contacted in 48-72 hours if gc/chl culture results are positive.  May FU with health dept is additional concerns.  Discussed plan with pt, she agreed.  Return precautions advised.  Garlon Hatchet, PA-C 03/29/13 1049

## 2013-03-30 LAB — URINE CULTURE: Colony Count: >=100000

## 2013-03-30 LAB — GC/CHLAMYDIA PROBE AMP: CT Probe RNA: NEGATIVE

## 2013-04-18 ENCOUNTER — Emergency Department (HOSPITAL_BASED_OUTPATIENT_CLINIC_OR_DEPARTMENT_OTHER)
Admission: EM | Admit: 2013-04-18 | Discharge: 2013-04-18 | Disposition: A | Discharge status: Home / Self Care | Payer: Medicaid Other | Attending: Emergency Medicine | Admitting: Emergency Medicine

## 2013-04-18 ENCOUNTER — Encounter (HOSPITAL_BASED_OUTPATIENT_CLINIC_OR_DEPARTMENT_OTHER): Payer: Self-pay | Admitting: Emergency Medicine

## 2013-04-18 DIAGNOSIS — N898 Other specified noninflammatory disorders of vagina: Secondary | ICD-10-CM | POA: Insufficient documentation

## 2013-04-18 DIAGNOSIS — F17200 Nicotine dependence, unspecified, uncomplicated: Secondary | ICD-10-CM | POA: Insufficient documentation

## 2013-04-18 DIAGNOSIS — N949 Unspecified condition associated with female genital organs and menstrual cycle: Secondary | ICD-10-CM | POA: Insufficient documentation

## 2013-04-18 DIAGNOSIS — B373 Candidiasis of vulva and vagina: Secondary | ICD-10-CM | POA: Insufficient documentation

## 2013-04-18 DIAGNOSIS — B3731 Acute candidiasis of vulva and vagina: Secondary | ICD-10-CM | POA: Insufficient documentation

## 2013-04-18 DIAGNOSIS — Z3202 Encounter for pregnancy test, result negative: Secondary | ICD-10-CM | POA: Insufficient documentation

## 2013-04-18 LAB — URINALYSIS, ROUTINE W REFLEX MICROSCOPIC
Bilirubin Urine: NEGATIVE
GLUCOSE, UA: NEGATIVE mg/dL
Ketones, ur: NEGATIVE mg/dL
Nitrite: NEGATIVE
PH: 6 (ref 5.0–8.0)
Protein, ur: NEGATIVE mg/dL
SPECIFIC GRAVITY, URINE: 1.029 (ref 1.005–1.030)
Urobilinogen, UA: 1 mg/dL (ref 0.0–1.0)

## 2013-04-18 LAB — WET PREP, GENITAL: TRICH WET PREP: NONE SEEN

## 2013-04-18 LAB — PREGNANCY, URINE: PREG TEST UR: NEGATIVE

## 2013-04-18 LAB — URINE MICROSCOPIC-ADD ON

## 2013-04-18 MED ORDER — FLUCONAZOLE 150 MG PO TABS: 150.0000 mg | ORAL_TABLET | Freq: Once | ORAL | Status: DC

## 2013-04-18 NOTE — ED Notes (Signed)
Patient here with complaint of vaginal itching, burning and labia swelling x 2 days. Also complains of yellowish discharge. denies urinary symptoms

## 2013-04-18 NOTE — Discharge Instructions (Signed)
Candidal Vulvovaginitis  Candidal vulvovaginitis is an infection of the vagina and vulva. The vulva is the skin around the opening of the vagina. This may cause itching and discomfort in and around the vagina.   HOME CARE  · Only take medicine as told by your doctor.  · Do not have sex (intercourse) until the infection is healed or as told by your doctor.  · Practice safe sex.  · Tell your sex partner about your infection.  · Do not douche or use tampons.  · Wear cotton underwear. Do not wear tight pants or panty hose.  · Eat yogurt. This may help treat and prevent yeast infections.  GET HELP RIGHT AWAY IF:   · You have a fever.  · Your problems get worse during treatment or do not get better in 3 days.  · You have discomfort, irritation, or itching in your vagina or vulva area.  · You have pain after sex.  · You start to get belly (abdominal) pain.  MAKE SURE YOU:  · Understand these instructions.  · Will watch your condition.  · Will get help right away if you are not doing well or get worse.  Document Released: 06/21/2008 Document Revised: 06/17/2011 Document Reviewed: 06/21/2008  Exit[employer]® Patient Information ©2014 [employer].

## 2013-04-18 NOTE — ED Provider Notes (Signed)
CSN: [id number]     Arrival date & time 04/18/13  [id number] History   First MD Initiated Contact with Patient 04/18/13 [phone]     Chief Complaint  Patient presents with  . Vaginal Itching   (Consider location/radiation/quality/duration/timing/severity/associated sxs/prior Treatment) Patient is a 28 y.o. female presenting with vaginal itching. The history is provided by the patient. No language interpreter was used.  Vaginal Itching This is a new problem. The current episode started 2 days ago. The problem occurs constantly. The problem has not changed since onset.Pertinent negatives include no chest pain, no abdominal pain, no headaches and no shortness of breath. Associated symptoms comments: Thick, clumpy discharge. Nothing aggravates the symptoms. Nothing relieves the symptoms. She has tried nothing for the symptoms. The treatment provided no relief.    Past Medical History  Diagnosis Date  . CIN II (cervical intraepithelial neoplasia II)    History reviewed. No pertinent past surgical history. No family history on file. History  Substance Use Topics  . Smoking status: Current Every Day Smoker -- 0.25 packs/day for 4 years    Types: Cigarettes  . Smokeless tobacco: Never Used  . Alcohol Use: Yes     Comment: occ   OB History   Grav Para Term Preterm Abortions TAB SAB Ect Mult Living   1 1 1  0 0 0 0 0 0 1     Review of Systems  Constitutional: Negative for fever, chills, diaphoresis, activity change, appetite change and fatigue.  HENT: Negative for congestion, facial swelling, rhinorrhea and sore throat.   Eyes: Negative for photophobia and discharge.  Respiratory: Negative for cough, chest tightness and shortness of breath.   Cardiovascular: Negative for chest pain, palpitations and leg swelling.  Gastrointestinal: Negative for nausea, vomiting, abdominal pain and diarrhea.  Endocrine: Negative for polydipsia and polyuria.  Genitourinary: Positive for vaginal discharge and vaginal  pain. Negative for dysuria, frequency, difficulty urinating and pelvic pain.  Musculoskeletal: Negative for arthralgias, back pain, neck pain and neck stiffness.  Skin: Negative for color change and wound.  Allergic/Immunologic: Negative for immunocompromised state.  Neurological: Negative for facial asymmetry, weakness, numbness and headaches.  Hematological: Does not bruise/bleed easily.  Psychiatric/Behavioral: Negative for confusion and agitation.    Allergies  Review of patient's allergies indicates no known allergies.  Home Medications   Current Outpatient Rx  Name  Route  Sig  Dispense  Refill  . fluconazole (DIFLUCAN) 150 MG tablet   Oral   Take 1 tablet (150 mg total) by mouth once. May repeat in 3 days if still having symptoms   1 tablet   1    BP 119/77  Pulse 120  Temp(Src) 99.4 F (37.4 C)  Resp 18  SpO2 100% Physical Exam  Constitutional: She is oriented to person, place, and time. She appears well-developed and well-nourished. No distress.  HENT:  Head: Normocephalic and atraumatic.  Mouth/Throat: No oropharyngeal exudate.  Eyes: Pupils are equal, round, and reactive to light.  Neck: Normal range of motion. Neck supple.  Cardiovascular: Normal rate, regular rhythm and normal heart sounds.  Exam reveals no gallop and no friction rub.   No murmur heard. Pulmonary/Chest: Effort normal and breath sounds normal. No respiratory distress. She has no wheezes. She has no rales.  Abdominal: Soft. Bowel sounds are normal. She exhibits no distension and no mass. There is no tenderness. There is no rebound and no guarding.  Genitourinary: Vaginal discharge (white, clumpy, adherent) found.  Musculoskeletal: Normal range of motion. She exhibits  no edema and no tenderness.  Neurological: She is alert and oriented to person, place, and time.  Skin: Skin is warm and dry.  Psychiatric: She has a normal mood and affect.    ED Course  Procedures (including critical care  time) Labs Review Labs Reviewed  WET PREP, GENITAL - Abnormal; Notable for the following:    Yeast Wet Prep HPF POC MANY (*)    Clue Cells Wet Prep HPF POC MANY (*)    WBC, Wet Prep HPF POC TOO NUMEROUS TO COUNT (*)    All other components within normal limits  URINALYSIS, ROUTINE W REFLEX MICROSCOPIC - Abnormal; Notable for the following:    Color, Urine AMBER (*)    APPearance CLOUDY (*)    Hgb urine dipstick SMALL (*)    Leukocytes, UA LARGE (*)    All other components within normal limits  URINE MICROSCOPIC-ADD ON - Abnormal; Notable for the following:    Squamous Epithelial / LPF MANY (*)    Bacteria, UA MANY (*)    All other components within normal limits  GC/CHLAMYDIA PROBE AMP  URINE CULTURE  PREGNANCY, URINE   Imaging Review No results found.  EKG Interpretation   None       MDM   1. Vaginal candidiasis    Pt is a 28 y.o. female with Pmhx as above who presents with 2 days of vaginal discomfort and thick, clumpy d/c.  No ab/pelvic pain, n/v, d/a, urinary symptoms, or partners w/ same.  On PE, VSS, pt in NAD.  Ab exam benign. Pelvic w/ thick, white, clumpy d/c.  Wet prep with yeast.  Will treat with diflucan.  Return precautions given for new or worsening symptoms including worsening pain, n/v, fever.          Shanna Cisco, MD 04/18/13 [id number]

## 2013-04-19 LAB — GC/CHLAMYDIA PROBE AMP
CT Probe RNA: NEGATIVE
GC Probe RNA: NEGATIVE

## 2013-04-19 LAB — URINE CULTURE: Colony Count: 70000

## 2014-02-07 ENCOUNTER — Encounter (HOSPITAL_BASED_OUTPATIENT_CLINIC_OR_DEPARTMENT_OTHER): Payer: Self-pay | Admitting: Emergency Medicine

## 2019-02-25 ENCOUNTER — Encounter (HOSPITAL_BASED_OUTPATIENT_CLINIC_OR_DEPARTMENT_OTHER): Payer: Self-pay | Admitting: *Deleted

## 2019-02-25 ENCOUNTER — Other Ambulatory Visit: Payer: Self-pay

## 2019-02-25 ENCOUNTER — Emergency Department (HOSPITAL_BASED_OUTPATIENT_CLINIC_OR_DEPARTMENT_OTHER)
Admission: EM | Admit: 2019-02-25 | Discharge: 2019-02-25 | Disposition: A | Discharge status: Home / Self Care | Payer: Self-pay | Attending: Emergency Medicine | Admitting: Emergency Medicine

## 2019-02-25 DIAGNOSIS — N61 Mastitis without abscess: Secondary | ICD-10-CM | POA: Insufficient documentation

## 2019-02-25 DIAGNOSIS — F17210 Nicotine dependence, cigarettes, uncomplicated: Secondary | ICD-10-CM | POA: Insufficient documentation

## 2019-02-25 MED ORDER — CLINDAMYCIN HCL 300 MG PO CAPS: 300.0000 mg | ORAL_CAPSULE | Freq: Four times a day (QID) | ORAL | 0 refills | Status: DC

## 2019-02-25 MED FILL — CLINDAMYCIN HCL 300 MG CAPS: 300 | 7 days supply | Qty: 28 | Fill #0

## 2019-02-25 NOTE — ED Triage Notes (Signed)
Lump to left breast started two weeks ago

## 2019-02-25 NOTE — ED Provider Notes (Signed)
[facility] EMERGENCY DEPARTMENT Provider Note   CSN: [id number] Arrival date & time: 02/25/19  5176     History   Chief Complaint Chief Complaint  Patient presents with  . Lump to left breast    HPI Wanda Haynes is a 33 y.o. female.     Patient is a 33 year old female who presents with tenderness and redness to her left breast.  She states it started with a little soreness about 2 weeks ago but is progressively gotten worse.  She denies any known fevers.  No drainage from the site.  No history of similar symptoms in the past.     Past Medical History:  Diagnosis Date  . CIN II (cervical intraepithelial neoplasia II)     There are no active problems to display for this patient.   History reviewed. No pertinent surgical history.   OB History    Gravida  1   Para  1   Term  1   Preterm  0   AB  0   Living  1     SAB  0   TAB  0   Ectopic  0   Multiple  0   Live Births  1            Home Medications    Prior to Admission medications   Medication Sig Start Date End Date Taking? Authorizing Provider  clindamycin (CLEOCIN) 300 MG capsule Take 1 capsule (300 mg total) by mouth 4 (four) times daily. X 7 days 02/25/19   Malvin Johns, MD    Family History History reviewed. No pertinent family history.  Social History Social History   Tobacco Use  . Smoking status: Current Every Day Smoker    Packs/day: 0.25    Years: 4.00    Pack years: 1.00    Types: Cigarettes  . Smokeless tobacco: Never Used  Substance Use Topics  . Alcohol use: Yes    Comment: occ  . Drug use: No     Allergies   Patient has no known allergies.   Review of Systems Review of Systems  Constitutional: Negative for fever.  Gastrointestinal: Negative for nausea and vomiting.  Musculoskeletal: Negative for back pain and neck pain.  Skin: Positive for color change. Negative for wound.       Redness swelling and warmth to the left breast   Neurological: Negative for weakness, numbness and headaches.     Physical Exam Updated Vital Signs BP (!) 140/97 (BP Location: Left Arm)   Pulse 98   Temp 99.3 F (37.4 C) (Oral)   Resp 14   Ht 5\' 2"  (1.575 m)   Wt 56.7 kg   LMP 02/24/2019   SpO2 100%   BMI 22.86 kg/m   Physical Exam Constitutional:      Appearance: She is well-developed.  HENT:     Head: Normocephalic and atraumatic.  Neck:     Musculoskeletal: Normal range of motion and neck supple.  Cardiovascular:     Rate and Rhythm: Normal rate.  Pulmonary:     Effort: Pulmonary effort is normal.  Skin:    General: Skin is warm and dry.     Comments: Patient has an indurated tender area about 2 x 3 cm to the left breast at the 9 o'clock position.  There is no fluctuance.  No open wounds.  There is some mild erythema and warmth overlying the area.  No drainage from the nipple.  Neurological:  Mental Status: She is alert and oriented to person, place, and time.      ED Treatments / Results  Labs (all labs ordered are listed, but only abnormal results are displayed) Labs Reviewed - No data to display  EKG None  Radiology No results found.  Procedures Procedures (including critical care time)  Medications Ordered in ED Medications - No data to display   Initial Impression / Assessment and Plan / ED Course  I have reviewed the triage vital signs and the nursing notes.  Pertinent labs & imaging results that were available during my care of the patient were reviewed by me and considered in my medical decision making (see chart for details).        Patient presents with what appears to be some cellulitis of the left breast with probable underlying abscess.  I did explain to her that we are unable to drain breast abscesses.  We will start her on antibiotics and refer her to the breast center.  I advised her to call this morning and try to get follow-up urgently in the breast center.  Return  precautions were given.  Final Clinical Impressions(s) / ED Diagnoses   Final diagnoses:  Cellulitis of breast    ED Discharge Orders         Ordered    clindamycin (CLEOCIN) 300 MG capsule  4 times daily     02/25/19 0736           Rolan Bucco, MD 02/25/19 0740

## 2019-02-25 NOTE — Discharge Instructions (Addendum)
Call the breast center to schedule a follow-up appointment within the next few days.  You likely will need ultrasound of the breast and drainage of the probable abscess.  Return here as needed if you have any worsening symptoms.

## 2019-02-25 NOTE — ED Notes (Signed)
Left breast is red and swollen and tender to touch.

## 2019-03-02 ENCOUNTER — Other Ambulatory Visit: Payer: Self-pay | Admitting: Emergency Medicine

## 2019-03-02 DIAGNOSIS — N6320 Unspecified lump in the left breast, unspecified quadrant: Secondary | ICD-10-CM

## 2020-04-07 ENCOUNTER — Encounter (HOSPITAL_COMMUNITY)
Admission: EM | Disposition: A | Discharge status: Home / Self Care | Payer: Self-pay | Source: Home / Self Care | Attending: Emergency Medicine

## 2020-04-07 ENCOUNTER — Inpatient Hospital Stay (HOSPITAL_COMMUNITY): Payer: Medicaid Other | Admitting: Certified Registered"

## 2020-04-07 ENCOUNTER — Other Ambulatory Visit: Payer: Self-pay

## 2020-04-07 ENCOUNTER — Encounter (HOSPITAL_BASED_OUTPATIENT_CLINIC_OR_DEPARTMENT_OTHER): Payer: Self-pay | Admitting: *Deleted

## 2020-04-07 ENCOUNTER — Emergency Department (HOSPITAL_BASED_OUTPATIENT_CLINIC_OR_DEPARTMENT_OTHER): Payer: Medicaid Other

## 2020-04-07 ENCOUNTER — Ambulatory Visit (HOSPITAL_BASED_OUTPATIENT_CLINIC_OR_DEPARTMENT_OTHER)
Admission: EM | Admit: 2020-04-07 | Discharge: 2020-04-07 | Disposition: A | Discharge status: Home / Self Care | Payer: Medicaid Other | Attending: Obstetrics and Gynecology | Admitting: Obstetrics and Gynecology

## 2020-04-07 DIAGNOSIS — F17210 Nicotine dependence, cigarettes, uncomplicated: Secondary | ICD-10-CM | POA: Insufficient documentation

## 2020-04-07 DIAGNOSIS — U071 COVID-19: Secondary | ICD-10-CM | POA: Insufficient documentation

## 2020-04-07 DIAGNOSIS — K661 Hemoperitoneum: Secondary | ICD-10-CM

## 2020-04-07 DIAGNOSIS — O009 Unspecified ectopic pregnancy without intrauterine pregnancy: Secondary | ICD-10-CM

## 2020-04-07 DIAGNOSIS — R1031 Right lower quadrant pain: Secondary | ICD-10-CM

## 2020-04-07 DIAGNOSIS — O00101 Right tubal pregnancy without intrauterine pregnancy: Secondary | ICD-10-CM

## 2020-04-07 HISTORY — PX: LAPAROSCOPIC UNILATERAL SALPINGECTOMY: SHX5934

## 2020-04-07 HISTORY — PX: DIAGNOSTIC LAPAROSCOPY WITH REMOVAL OF ECTOPIC PREGNANCY: SHX6449

## 2020-04-07 LAB — URINALYSIS, ROUTINE W REFLEX MICROSCOPIC
Bilirubin Urine: NEGATIVE
Glucose, UA: NEGATIVE mg/dL
Hgb urine dipstick: NEGATIVE
Ketones, ur: 80 mg/dL — AB
Leukocytes,Ua: NEGATIVE
Nitrite: NEGATIVE
Protein, ur: NEGATIVE mg/dL
Specific Gravity, Urine: 1.02 (ref 1.005–1.030)
pH: 6.5 (ref 5.0–8.0)

## 2020-04-07 LAB — LIPASE, BLOOD: Lipase: 27 U/L (ref 11–51)

## 2020-04-07 LAB — CBC
HCT: 40.7 % (ref 36.0–46.0)
Hemoglobin: 13.7 g/dL (ref 12.0–15.0)
MCH: 31.4 pg (ref 26.0–34.0)
MCHC: 33.7 g/dL (ref 30.0–36.0)
MCV: 93.3 fL (ref 80.0–100.0)
Platelets: 218 10*3/uL (ref 150–400)
RBC: 4.36 MIL/uL (ref 3.87–5.11)
RDW: 12.5 % (ref 11.5–15.5)
WBC: 7.4 10*3/uL (ref 4.0–10.5)
nRBC: 0 % (ref 0.0–0.2)

## 2020-04-07 LAB — RESP PANEL BY RT-PCR (FLU A&B, COVID) ARPGX2
Influenza A by PCR: NEGATIVE
Influenza B by PCR: NEGATIVE
SARS Coronavirus 2 by RT PCR: POSITIVE — AB

## 2020-04-07 LAB — COMPREHENSIVE METABOLIC PANEL
ALT: 11 U/L (ref 0–44)
AST: 19 U/L (ref 15–41)
Albumin: 4.7 g/dL (ref 3.5–5.0)
Alkaline Phosphatase: 43 U/L (ref 38–126)
Anion gap: 10 (ref 5–15)
BUN: 9 mg/dL (ref 6–20)
CO2: 25 mmol/L (ref 22–32)
Calcium: 9.5 mg/dL (ref 8.9–10.3)
Chloride: 104 mmol/L (ref 98–111)
Creatinine, Ser: 0.83 mg/dL (ref 0.44–1.00)
GFR, Estimated: >60 mL/min (ref >60)
Glucose, Bld: 102 mg/dL — ABNORMAL HIGH (ref 70–99)
Potassium: 4 mmol/L (ref 3.5–5.1)
Sodium: 139 mmol/L (ref 135–145)
Total Bilirubin: 0.6 mg/dL (ref 0.3–1.2)
Total Protein: 8.2 g/dL — ABNORMAL HIGH (ref 6.5–8.1)

## 2020-04-07 LAB — TYPE AND SCREEN
ABO/RH(D): AB POS
Antibody Screen: NEGATIVE

## 2020-04-07 LAB — ABO/RH: ABO/RH(D): AB POS

## 2020-04-07 LAB — HCG, QUANTITATIVE, PREGNANCY: hCG, Beta Chain, Quant, S: 2649 m[IU]/mL — ABNORMAL HIGH (ref <5)

## 2020-04-07 LAB — PREGNANCY, URINE: Preg Test, Ur: POSITIVE — AB

## 2020-04-07 SURGERY — LAPAROSCOPY, WITH ECTOPIC PREGNANCY SURGICAL TREATMENT
Anesthesia: General | Site: Abdomen

## 2020-04-07 MED ORDER — LIDOCAINE HCL (CARDIAC) PF 100 MG/5ML IV SOSY
PREFILLED_SYRINGE | INTRAVENOUS | Status: DC | PRN
  Administered 2020-04-07: 50 mg via INTRAVENOUS

## 2020-04-07 MED ORDER — OXYCODONE HCL 5 MG/5ML PO SOLN: 5.0000 mg | Freq: Once | ORAL | Status: DC | PRN

## 2020-04-07 MED ORDER — SUCCINYLCHOLINE CHLORIDE 20 MG/ML IJ SOLN
INTRAMUSCULAR | Status: DC | PRN
  Administered 2020-04-07: 120 mg via INTRAVENOUS

## 2020-04-07 MED ORDER — MIDAZOLAM HCL 2 MG/2ML IJ SOLN
INTRAMUSCULAR | Status: AC
  Filled 2020-04-07: qty 2

## 2020-04-07 MED ORDER — FENTANYL CITRATE (PF) 100 MCG/2ML IJ SOLN
INTRAMUSCULAR | Status: DC | PRN
  Administered 2020-04-07: 100 ug via INTRAVENOUS
  Administered 2020-04-07: 50 ug via INTRAVENOUS

## 2020-04-07 MED ORDER — SUGAMMADEX SODIUM 200 MG/2ML IV SOLN
INTRAVENOUS | Status: DC | PRN
  Administered 2020-04-07: 150 mg via INTRAVENOUS

## 2020-04-07 MED ORDER — LACTATED RINGERS IV SOLN: INTRAVENOUS | Status: DC

## 2020-04-07 MED ORDER — PROPOFOL 10 MG/ML IV BOLUS
INTRAVENOUS | Status: DC | PRN
  Administered 2020-04-07: 150 mg via INTRAVENOUS

## 2020-04-07 MED ORDER — FENTANYL CITRATE (PF) 100 MCG/2ML IJ SOLN
INTRAMUSCULAR | Status: AC
  Filled 2020-04-07: qty 2

## 2020-04-07 MED ORDER — BUPIVACAINE HCL (PF) 0.25 % IJ SOLN
INTRAMUSCULAR | Status: AC
  Filled 2020-04-07: qty 20

## 2020-04-07 MED ORDER — LACTATED RINGERS IV SOLN: INTRAVENOUS | Status: DC | PRN

## 2020-04-07 MED ORDER — OXYCODONE HCL 5 MG PO TABS
ORAL_TABLET | ORAL | Status: AC
  Filled 2020-04-07: qty 1

## 2020-04-07 MED ORDER — OXYCODONE HCL 5 MG PO TABS: 5.0000 mg | ORAL_TABLET | Freq: Once | ORAL | Status: DC | PRN

## 2020-04-07 MED ORDER — SODIUM CHLORIDE 0.9 % IR SOLN
Status: DC | PRN
  Administered 2020-04-07: 1000 mL

## 2020-04-07 MED ORDER — ONDANSETRON HCL 4 MG/2ML IJ SOLN
INTRAMUSCULAR | Status: AC
  Filled 2020-04-07: qty 2

## 2020-04-07 MED ORDER — BUPIVACAINE HCL 0.25 % IJ SOLN
INTRAMUSCULAR | Status: DC | PRN
  Administered 2020-04-07: 10 mL

## 2020-04-07 MED ORDER — SOD CITRATE-CITRIC ACID 500-334 MG/5ML PO SOLN: 30.0000 mL | ORAL | Status: DC

## 2020-04-07 MED ORDER — FENTANYL CITRATE (PF) 250 MCG/5ML IJ SOLN
INTRAMUSCULAR | Status: AC
  Filled 2020-04-07: qty 5

## 2020-04-07 MED ORDER — POVIDONE-IODINE 10 % EX SWAB: 2.0000 "application " | Freq: Once | CUTANEOUS | Status: DC

## 2020-04-07 MED ORDER — ROCURONIUM BROMIDE 100 MG/10ML IV SOLN
INTRAVENOUS | Status: DC | PRN
  Administered 2020-04-07: 40 mg via INTRAVENOUS

## 2020-04-07 MED ORDER — ONDANSETRON HCL 4 MG/2ML IJ SOLN
INTRAMUSCULAR | Status: DC | PRN
  Administered 2020-04-07: 4 mg via INTRAVENOUS

## 2020-04-07 MED ORDER — MIDAZOLAM HCL 5 MG/5ML IJ SOLN
INTRAMUSCULAR | Status: DC | PRN
  Administered 2020-04-07: 2 mg via INTRAVENOUS

## 2020-04-07 MED ORDER — LACTATED RINGERS IV BOLUS
1000.0000 mL | Freq: Once | INTRAVENOUS | Status: AC
  Administered 2020-04-07: 1000 mL via INTRAVENOUS

## 2020-04-07 MED ORDER — ONDANSETRON HCL 4 MG/2ML IJ SOLN: 4.0000 mg | Freq: Once | INTRAMUSCULAR | Status: DC | PRN

## 2020-04-07 MED ORDER — DEXAMETHASONE SODIUM PHOSPHATE 4 MG/ML IJ SOLN
INTRAMUSCULAR | Status: DC | PRN
  Administered 2020-04-07: 4 mg via INTRAVENOUS

## 2020-04-07 MED ORDER — FENTANYL CITRATE (PF) 100 MCG/2ML IJ SOLN: 25.0000 ug | INTRAMUSCULAR | Status: DC | PRN

## 2020-04-07 SURGICAL SUPPLY — 29 items
COVER WAND RF STERILE (DRAPES) ×2 IMPLANT
DRSG COVADERM PLUS 2X2 (GAUZE/BANDAGES/DRESSINGS) IMPLANT
DRSG OPSITE POSTOP 3X4 (GAUZE/BANDAGES/DRESSINGS) IMPLANT
DURAPREP 26ML APPLICATOR (WOUND CARE) ×2 IMPLANT
GLOVE BIOGEL PI IND STRL 6.5 (GLOVE) ×2 IMPLANT
GLOVE BIOGEL PI IND STRL 7.0 (GLOVE) ×2 IMPLANT
GLOVE BIOGEL PI INDICATOR 6.5 (GLOVE) ×2
GLOVE BIOGEL PI INDICATOR 7.0 (GLOVE) ×2
GLOVE ORTHOPEDIC STR SZ6.5 (GLOVE) ×2 IMPLANT
GOWN STRL REUS W/ TWL LRG LVL3 (GOWN DISPOSABLE) ×3 IMPLANT
GOWN STRL REUS W/TWL LRG LVL3 (GOWN DISPOSABLE) ×3
KIT TURNOVER KIT B (KITS) ×2 IMPLANT
LIGASURE VESSEL 5MM BLUNT TIP (ELECTROSURGICAL) ×2 IMPLANT
NEEDLE INSUFFLATION 14GA 120MM (NEEDLE) ×2 IMPLANT
NS IRRIG 1000ML POUR BTL (IV SOLUTION) ×2 IMPLANT
PACK LAPAROSCOPY BASIN (CUSTOM PROCEDURE TRAY) ×2 IMPLANT
PACK TRENDGUARD 450 HYBRID PRO (MISCELLANEOUS) ×1 IMPLANT
POUCH SPECIMEN RETRIEVAL 10MM (ENDOMECHANICALS) ×2 IMPLANT
SET IRRIG TUBING LAPAROSCOPIC (IRRIGATION / IRRIGATOR) ×2 IMPLANT
SET TUBE SMOKE EVAC HIGH FLOW (TUBING) ×2 IMPLANT
SLEEVE XCEL OPT CAN 5 100 (ENDOMECHANICALS) ×2 IMPLANT
SUT MNCRL AB 4-0 PS2 18 (SUTURE) ×2 IMPLANT
SUT VICRYL 0 UR6 27IN ABS (SUTURE) ×2 IMPLANT
TOWEL GREEN STERILE FF (TOWEL DISPOSABLE) ×4 IMPLANT
TRAY FOLEY W/BAG SLVR 14FR (SET/KITS/TRAYS/PACK) ×2 IMPLANT
TRENDGUARD 450 HYBRID PRO PACK (MISCELLANEOUS) ×2
TROCAR XCEL NON-BLD 11X100MML (ENDOMECHANICALS) ×2 IMPLANT
TROCAR XCEL NON-BLD 5MMX100MML (ENDOMECHANICALS) ×2 IMPLANT
WARMER LAPAROSCOPE (MISCELLANEOUS) ×2 IMPLANT

## 2020-04-07 NOTE — MAU Note (Signed)
OB OR Charge Nurse notified at 2020 to give report on the patient. OR Charge RN informed this RN to call CRNA for report to be given.  CRNA, Zollie Scale, notified of patient status and report given. Informed that patient is COVID+ and Hgb of 13.8.   CRNA informed patient would be taken straight back to OR 7 and that they are ready for patient.

## 2020-04-07 NOTE — Anesthesia Preprocedure Evaluation (Addendum)
Anesthesia Evaluation  Patient identified by MRN, date of birth, ID band Patient awake    Reviewed: Allergy & Precautions, NPO status , Unable to perform ROS - Chart review only  Airway Mallampati: II  TM Distance: >3 FB Neck ROM: Full    Dental  (+) Teeth Intact, Dental Advisory Given   Pulmonary Current Smoker,    breath sounds clear to auscultation       Cardiovascular  Rhythm:Regular Rate:Normal     Neuro/Psych    GI/Hepatic   Endo/Other    Renal/GU      Musculoskeletal   Abdominal   Peds  Hematology   Anesthesia Other Findings   Reproductive/Obstetrics                            Anesthesia Physical Anesthesia Plan  ASA: II  Anesthesia Plan: General   Post-op Pain Management:    Induction: Intravenous, Rapid sequence and Cricoid pressure planned  PONV Risk Score and Plan:   Airway Management Planned: Oral ETT  Additional Equipment:   Intra-op Plan:   Post-operative Plan: Extubation in OR  Informed Consent: I have reviewed the patients History and Physical, chart, labs and discussed the procedure including the risks, benefits and alternatives for the proposed anesthesia with the patient or authorized representative who has indicated his/her understanding and acceptance.     Dental advisory given  Plan Discussed with: CRNA and Anesthesiologist  Anesthesia Plan Comments:         Anesthesia Quick Evaluation

## 2020-04-07 NOTE — Anesthesia Postprocedure Evaluation (Signed)
Anesthesia Post Note  Patient: [place]  Procedure(s) Performed: DIAGNOSTIC LAPAROSCOPY WITH REMOVAL OF ECTOPIC PREGNANCY (N/A Abdomen) LAPAROSCOPIC RIGHT SALPINGECTOMY (N/A Abdomen)     Patient location during evaluation: Other (Patient recovered in OR due to Covid status) Anesthesia Type: General Level of consciousness: awake and alert and patient cooperative Pain management: pain level controlled Vital Signs Assessment: post-procedure vital signs reviewed and stable Respiratory status: spontaneous breathing and nonlabored ventilation Cardiovascular status: blood pressure returned to baseline Postop Assessment: no apparent nausea or vomiting Anesthetic complications: no   No complications documented.  Last Vitals:  Vitals:   04/07/20 1946 04/07/20 2225  BP: 123/67 (!) 132/94  Pulse: 74 77  Resp: 18 18  Temp: 36.7 C (!) 36.4 C  SpO2: 100% 100%    Last Pain:  Vitals:   04/07/20 2225  TempSrc:   PainSc: 0-No pain                 Beena Catano COKER

## 2020-04-07 NOTE — ED Notes (Signed)
[facility] is here and takes her from our facility per stretcher without incident. She remains comfortable, ca.m and in no distress.

## 2020-04-07 NOTE — H&P (Addendum)
OB/GYN Pre-Op History and Physical  Wanda Haynes is a 34 y.o. G2P1001 presenting for intermittent abdominal/pelvic pain for several weeks, does not have regular periods and did not know she was pregnant prior to today. Pain worsened to 5/10 today and so she presented to [facility]. Found to have ruptured ectopic pregnancy on [place] at [facility]. She is otherwise feeling well but has not eaten since Tuesday, 12/29.      Past Medical History:  Diagnosis Date  . CIN II (cervical intraepithelial neoplasia II)     History reviewed. No pertinent surgical history.  OB History  Gravida Para Term Preterm AB Living  2 1 1  0 0 1  SAB IAB Ectopic Multiple Live Births  0 0 0 0 1    # Outcome Date GA Lbr Len/2nd Weight Sex Delivery Anes PTL Lv  2 Current           1 Term 04/26/09 [redacted]w[redacted]d  2892 g F Vag-Spont   LIV    Social History   Socioeconomic History  . Marital status: Single    Spouse name: Not on file  . Number of children: Not on file  . Years of education: Not on file  . Highest education level: Not on file  Occupational History  . Not on file  Tobacco Use  . Smoking status: Current Every Day Smoker    Packs/day: 0.25    Years: 4.00    Pack years: 1.00    Types: Cigarettes  . Smokeless tobacco: Never Used  Vaping Use  . Vaping Use: Never used  Substance and Sexual Activity  . Alcohol use: Yes    Comment: occ  . Drug use: No  . Sexual activity: Yes  Other Topics Concern  . Not on file  Social History Narrative  . Not on file   Social Determinants of Health   Financial Resource Strain: Not on file  Food Insecurity: Not on file  Transportation Needs: Not on file  Physical Activity: Not on file  Stress: Not on file  Social Connections: Not on file    No family history on file.  Medications Prior to Admission  Medication Sig Dispense Refill Last Dose  . clindamycin (CLEOCIN) 300 MG capsule Take 1 capsule (300 mg total) by mouth 4 (four) times daily. X 7  days 28 capsule 0     No Known Allergies  Review of Systems: Negative except for what is mentioned in HPI.     Physical Exam: BP 123/67   Pulse 74   Temp 98.1 F (36.7 C)   Resp 18   Ht 5\' 2"  (1.575 m)   Wt 56.2 kg   LMP 03/17/2020 (Exact Date)   SpO2 100%   BMI 22.68 kg/m  CONSTITUTIONAL: Well-developed, well-nourished female in no acute distress.  HENT:  Normocephalic, atraumatic, External right and left ear normal. Oropharynx is clear and moist EYES: Conjunctivae and EOM are normal. Pupils are equal, round, and reactive to light. No scleral icterus.  NECK: Normal range of motion, supple, no masses SKIN: Skin is warm and dry. No rash noted. Not diaphoretic. No erythema. No pallor. NEUROLGIC: Alert and oriented to person, place, and time. Normal reflexes, muscle tone coordination. No cranial nerve deficit noted. PSYCHIATRIC: Normal mood and affect. Normal behavior. Normal judgment and thought content. CARDIOVASCULAR: Normal heart rate noted RESPIRATORY: Effort normal, no problems with respiration noted ABDOMEN: Soft, moderately tender, nondistended PELVIC: Deferred MUSCULOSKELETAL: Normal range of motion. No edema and no tenderness.  2+ distal pulses.   Pertinent Labs/Studies:   Results for orders placed or performed during the hospital encounter of 04/07/20 (from the past 72 hour(s))  Urinalysis, Routine w reflex microscopic Urine, Clean Catch     Status: Abnormal   Collection Time: 04/07/20  1:19 PM  Result Value Ref Range   Color, Urine YELLOW YELLOW   APPearance CLEAR CLEAR   Specific Gravity, Urine 1.020 1.005 - 1.030   pH 6.5 5.0 - 8.0   Glucose, UA NEGATIVE NEGATIVE mg/dL   Hgb urine dipstick NEGATIVE NEGATIVE   Bilirubin Urine NEGATIVE NEGATIVE   Ketones, ur 80 (A) NEGATIVE mg/dL   Protein, ur NEGATIVE NEGATIVE mg/dL   Nitrite NEGATIVE NEGATIVE   Leukocytes,Ua NEGATIVE NEGATIVE    Comment: Microscopic not done on urines with negative protein, blood,  leukocytes, nitrite, or glucose < 500 mg/dL. Performed at [facility], [address] [place], [place] [address]   Pregnancy, urine     Status: Abnormal   Collection Time: 04/07/20  1:19 PM  Result Value Ref Range   Preg Test, Ur POSITIVE (A) NEGATIVE    Comment:        THE SENSITIVITY OF THIS METHODOLOGY IS >20 mIU/mL. Performed at [facility], [address] [place], [place] [address]   Lipase, blood     Status: None   Collection Time: 04/07/20  1:48 PM  Result Value Ref Range   Lipase 27 11 - 51 U/L    Comment: Performed at [facility], [address] [place], [place] [address]  Comprehensive metabolic panel     Status: Abnormal   Collection Time: 04/07/20  1:48 PM  Result Value Ref Range   Sodium 139 135 - 145 mmol/L   Potassium 4.0 3.5 - 5.1 mmol/L   Chloride 104 98 - 111 mmol/L   CO2 25 22 - 32 mmol/L   Glucose, Bld 102 (H) 70 - 99 mg/dL    Comment: Glucose reference range applies only to samples taken after fasting for at least 8 hours.   BUN 9 6 - 20 mg/dL   Creatinine, Ser 1.60 0.44 - 1.00 mg/dL   Calcium 9.5 8.9 - 10.9 mg/dL   Total Protein 8.2 (H) 6.5 - 8.1 g/dL   Albumin 4.7 3.5 - 5.0 g/dL   AST 19 15 - 41 U/L   ALT 11 0 - 44 U/L   Alkaline Phosphatase 43 38 - 126 U/L   Total Bilirubin 0.6 0.3 - 1.2 mg/dL   GFR, Estimated >32 >35 mL/min    Comment: (NOTE) Calculated using the CKD-EPI Creatinine Equation (2021)    Anion gap 10 5 - 15    Comment: Performed at [facility], [address] [place], [place] [address]  CBC     Status: None   Collection Time: 04/07/20  1:48 PM  Result Value Ref Range   WBC 7.4 4.0 - 10.5 K/uL   RBC 4.36 3.87 - 5.11 MIL/uL   Hemoglobin 13.7 12.0 - 15.0 g/dL   HCT 02.5 42.7 - 06.2 %   MCV 93.3 80.0 - 100.0 fL   MCH 31.4 26.0 - 34.0 pg   MCHC 33.7 30.0 - 36.0 g/dL   RDW 37.6 28.3 - 15.1 %   Platelets 218 150 - 400 K/uL   nRBC 0.0 0.0 - 0.2 %    Comment: Performed at [facility], [address] [place], [place]  [address]  hCG, quantitative, pregnancy     Status: Abnormal   Collection Time: 04/07/20  1:48 PM  Result Value Ref Range   hCG, Beta Chain, Quant, S 2,649 (H) <5 mIU/mL    Comment:          GEST. AGE      CONC.  (mIU/mL)   <=1 WEEK        5 - 50     2 WEEKS       50 - 500     3 WEEKS       100 - 10,000     4 WEEKS     1,000 - 30,000     5 WEEKS     3,500 - 115,000   6-8 WEEKS     12,000 - 270,000    12 WEEKS     15,000 - 220,000        FEMALE AND NON-PREGNANT FEMALE:     LESS THAN 5 mIU/mL Performed at [facility] [address] [place], [place] [address]   Resp Panel by RT-PCR (Flu A&B, Covid) Nasopharyngeal Swab     Status: Abnormal   Collection Time: 04/07/20  6:14 PM   Specimen: Nasopharyngeal Swab; Nasopharyngeal(NP) swabs in vial transport medium  Result Value Ref Range   SARS Coronavirus 2 by RT PCR POSITIVE (A) NEGATIVE    Comment: RESULT CALLED TO, READ BACK BY AND VERIFIED WITH: SAM COBLE RN AT 1918 ON 04/07/20 BY I.SUGUT (NOTE) SARS-CoV-2 target nucleic acids are DETECTED.  The SARS-CoV-2 RNA is generally detectable in upper respiratory specimens during the acute phase of infection. Positive results are indicative of the presence of the identified virus, but do not rule out bacterial infection or co-infection with other pathogens not detected by the test. Clinical correlation with patient history and other diagnostic information is necessary to determine patient infection status. The expected result is Negative.  Fact Sheet for Patients: BloggerCourse.com  Fact Sheet for Healthcare Providers: SeriousBroker.it  This test is not yet approved or cleared by the [place] FDA and  has been authorized for detection and/or diagnosis of SARS-CoV-2 by FDA under an Emergency Use Authorization (EUA).  This EUA will remain in effect (meaning this t est can be  used) for the duration of  the COVID-19 declaration under Section 564(b)(1) of the Act, 21 U.S.C. section 360bbb-3(b)(1), unless the authorization is terminated or revoked sooner.     Influenza A by PCR NEGATIVE NEGATIVE   Influenza B by PCR NEGATIVE NEGATIVE    Comment: (NOTE) The Xpert Xpress SARS-CoV-2/FLU/RSV plus assay is intended as an aid in the diagnosis of influenza from Nasopharyngeal swab specimens and should not be used as a sole basis for treatment. Nasal washings and aspirates are unacceptable for Xpert Xpress SARS-CoV-2/FLU/RSV testing.  Fact Sheet for Patients: BloggerCourse.com  Fact Sheet for Healthcare Providers: SeriousBroker.it  This test is not yet approved or cleared by the [place] FDA and has been authorized for detection and/or diagnosis of SARS-CoV-2 by FDA under an Emergency Use Authorization (EUA). This EUA will remain in effect (meaning this test can be used) for the duration of the COVID-19 declaration under Section 564(b)(1) of the Act, 21 U.S.C. section 360bbb-3(b)(1), unless the authorization is terminated or revoked.  Performed at [facility], [address] [place], [place] [address]    Narrative & Impression  CLINICAL DATA:  Right lower quadrant pain  EXAM: OBSTETRIC <14 WK [place] AND  TRANSVAGINAL OB US  TECHNIQUE: Both transabdominal and transvaginal ultrasound examinations were performed for complete evaluation of the gestation as well as the maternal uterus, adnexal regions, and pelvic cul-de-sac. Transvaginal technique was performed to assess early pregnancy.  COMPARISON:  None.  FINDINGS: Intrauterine gestational sac: None  Yolk sac:  Visualized in the right adnexa.  Embryo:  Visualized within the right adnexa.  Cardiac Activity: Not Visualized.  CRL: 13.6 mm   7 w   4 d                  US EDC: 11/20/2020  Subchorionic hemorrhage:  None  visualized.  Maternal uterus/adnexae: Left ovary is within normal limits and measures 3.3 x 1.5 x 1.9 cm. The right ovary measures 2.9 x 1.8 x 1.5 cm. Heterogenous right adnexal mass measuring 8.1 x 3.1 x 3.8 cm. Within the right adnexa, medial and superior to the right ovary is an apparent ectopic pregnancy with visible gestational sac, embryo, and yolk sac. The 8.1 cm heterogenous adnexal mass is visualized between the ectopic pregnancy and the ovary and is concerning for tubal hemorrhage. There is moderate complex fluid in the pelvis.  IMPRESSION: 1. Findings highly suspicious for ruptured right ectopic pregnancy. Fetal pole identified in the right adnexa but no fetal cardiac activity. Large heterogenous right adnexal mass suspicious for tubal hemorrhage with moderate complex free fluid in the pelvis. No IUP identified.  Critical Value/emergent results were called by telephone at the time of interpretation on 04/07/2020 at 6:11 pm to provider [facility] , who verbally acknowledged these results.   Electronically Signed   By: Jasmine PangKim  Fujinaga M.D.   On: 04/07/2020 18:12        Assessment and Plan :Sarita BottomBrittany N Daughtry is a 34 y.o. G2P1001 transferred here from [facility] for suspected ruptured ectopic pregnancy on ultrasound. She appears stable, minimal pain with just sitting and moderate tenderness with palpation.   Reviewed US findings, recommendation for surgical management for suspected ruptured ectopic pregnancy. Reviewed that surgery will likely involve removing fallopian tube, reviewed that she will be able to achieve pregnancy with one tube. Reviewed will only remove/repair structures as necessary. She verbalizes understanding of the above.  The risks of laparoscopic surgery were discussed with the patient including but not limited to: bleeding which may require transfusion or reoperation; infection which may require antibiotics; injury to bowel, bladder,  ureters or other surrounding organs; need for additional procedures including laparotomy; thromboembolic phenomenon, incisional problems and other postoperative/anesthesia complications. Patient verbalized understanding of the above and consent signed. Answered all questions.  She is agreeable to blood transfusion in the event of emergency.   Main OR aware, they will call me back with time for surgery, pt VS stable Of note, she is COVID positive and asymptomatic Plan for laparoscopic removal of ectopic pregnancy NPO Type & Screen    Kirtland BouchardK. Therese SarahMeryl Neisha Hinger, M.D. Attending Obstetrician & Gynecologist, [facility] for [facility], [facility]

## 2020-04-07 NOTE — Discharge Instructions (Signed)
Diagnostic Laparoscopy, Care After This sheet gives you information about how to care for yourself after your procedure. Your health care provider may also give you more specific instructions. If you have problems or questions, contact your health care provider. What can I expect after the procedure? After the procedure, it is common to have:  Mild discomfort in the abdomen.  Sore throat. Women who have laparoscopy with pelvic examination may have mild cramping and fluid coming from the vagina for a few days after the procedure. Follow these instructions at home: Medicines  Take over-the-counter and prescription medicines only as told by your health care provider.  If you were prescribed an antibiotic medicine, take it as told by your health care provider. Do not stop taking the antibiotic even if you start to feel better. Driving  Do not drive for 24 hours if you were given a medicine to help you relax (sedative) during your procedure.  Do not drive or use heavy machinery while taking prescription pain medicine. Bathing  Do not take baths, swim, or use a hot tub until your health care provider approves. You may take showers. Incision care   Follow instructions from your health care provider about how to take care of your incisions. Make sure you: ? Wash your hands with soap and water before you change your bandage (dressing). If soap and water are not available, use hand sanitizer. ? Change your dressing as told by your health care provider. ? Leave stitches (sutures), skin glue, or adhesive strips in place. These skin closures may need to stay in place for 2 weeks or longer. If adhesive strip edges start to loosen and curl up, you may trim the loose edges. Do not remove adhesive strips completely unless your health care provider tells you to do that.  Check your incision areas every day for signs of infection. Check for: ? Redness, swelling, or pain. ? Fluid or  blood. ? Warmth. ? Pus or a bad smell. Activity  Return to your normal activities as told by your health care provider. Ask your health care provider what activities are safe for you.  Do not lift anything that is heavier than 10 lb (4.5 kg), or the limit that you are told, until your health care provider says that it is safe. General instructions  To prevent or treat constipation while you are taking prescription pain medicine, your health care provider may recommend that you: ? Drink enough fluid to keep your urine pale yellow. ? Take over-the-counter or prescription medicines. ? Eat foods that are high in fiber, such as fresh fruits and vegetables, whole grains, and beans. ? Limit foods that are high in fat and processed sugars, such as fried and sweet foods.  Do not use any products that contain nicotine or tobacco, such as cigarettes and e-cigarettes. If you need help quitting, ask your health care provider.  Keep all follow-up visits as told by your health care provider. This is important. Contact a health care provider if:  You develop shoulder pain.  You feel lightheaded or faint.  You are unable to pass gas or have a bowel movement.  You feel nauseous or you vomit.  You develop a rash.  You have redness, swelling, or pain around any incision.  You have fluid or blood coming from any incision.  Any incision feels warm to the touch.  You have pus or a bad smell coming from any incision.  You have a fever or chills. Get help   right away if:  You have severe pain.  You have vomiting that does not go away.  You have heavy bleeding from the vagina.  Any incision opens.  You have trouble breathing.  You have chest pain. Summary  After the procedure, it is common to have mild discomfort in the abdomen and a sore throat.  Check your incision areas every day for signs of infection.  Return to your normal activities as told by your health care provider. Ask  your health care provider what activities are safe for you. This information is not intended to replace advice given to you by your health care provider. Make sure you discuss any questions you have with your health care provider. Document Revised: 03/07/2017 Document Reviewed: 09/18/2016 Elsevier Patient Education  2020 [employer]vier Inc.  

## 2020-04-07 NOTE — Progress Notes (Signed)
Received call from [facility], patient is 34 yo G2 P1001 with spotting, found to have ruptured ectopic pregnancy on right side, fetal pole identified with no cardiac activity, large heterogenous mass suspicious for tubal hemorrhage. Moderate complex free fluid in pelvis.  OR alerted to pending case, per PA at [facility], patient vital signs stable, patient to be transported to MAU. Remain NPO. MAU staff aware.   Baldemar Lenis, M.D. Attending Center for [employer] [facility])

## 2020-04-07 NOTE — Transfer of Care (Signed)
Immediate Anesthesia Transfer of Care Note  Patient: Wanda Haynes  Procedure(s) Performed: DIAGNOSTIC LAPAROSCOPY WITH REMOVAL OF ECTOPIC PREGNANCY (N/A Abdomen) LAPAROSCOPIC RIGHT SALPINGECTOMY (N/A Abdomen)  Patient Location: PACU  Anesthesia Type:General  Level of Consciousness: awake, alert , oriented and patient cooperative  Airway & Oxygen Therapy: Patient Spontanous Breathing  Post-op Assessment: Report given to RN and Post -op Vital signs reviewed and stable  Post vital signs: Reviewed and stable  Last Vitals:  Vitals Value Taken Time  BP 156/91   Temp    Pulse 84   Resp 15   SpO2 100     Last Pain:  Vitals:   04/07/20 1945  TempSrc:   PainSc: 6          Complications: No complications documented.

## 2020-04-07 NOTE — Op Note (Signed)
Sarita Bottom PROCEDURE DATE: 04/07/2020  PREOPERATIVE DIAGNOSES: suspected ruptured ectopic pregnancy POSTOPERATIVE DIAGNOSES: massive right sided tubal dilation with tubal bleeding from fimbriae PROCEDURE: laparoscopic right salpingectomy, removal of ectopic pregnancy, evacuation of hemoperitoneum SURGEON:  Dr. Baldemar Lenis ASSISTANT: none ANESTHESIOLOGY TEAM: Anesthesiologist: Kipp Brood, MD CRNA: Tillman Abide, CRNA  INDICATIONS: 34 y.o. G2P1001 with aforementioned preoperative diagnoses here today for definitive surgical management.   Risks of surgery were discussed with the patient including but not limited to: bleeding which may require transfusion or reoperation; infection which may require antibiotics; injury to bowel, bladder, ureters or other surrounding organs; need for additional procedures including laparotomy; thromboembolic phenomenon, incisional problems and other postoperative/anesthesia complications. Written informed consent was obtained.    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  She was then taken to the operating room where general anesthesia was administered and was found to be adequate.  She was placed in the dorsal lithotomy position, and was prepped and draped in a sterile manner.  A Foley catheter was inserted into her bladder and attached to constant drainage. A timeout was performed to verify patient and procedure. A uterine manipulator  was then advanced into the uterus. Attention was then turned to the patient's abdomen where a 5-mm skin incision was made in the umbilical fold.    The 5 mm Optiview port was advanced under visualization with the endoscope until the peritoneum was entered. The introducer was removed and the camera was replaced within the port to confirm intraperitoneum placement. Once this was accomplished, the carbon dioxide gas was insufflated with an opening pressure of 5 mm Hg. The abdomen was visualized and survey of the abdomen showed atraumatic entry. Survey of the abdomen and pelvis were noted as above. A 5 mm port was placed into the left lower abdomen 2 cm proximal and median from the left ASIS after incision with a scalpel. The port was advanced under direct visualization. An 11 mm port was placed in the suprapubic area after a transverse incision was made with the scalpel. The port was advanced under direct visualization.   The patient was placed into trendelenburg and the right tube was grasped using an atraumatic grasper. The fallopian tube was removed using a 5 mm Ligasure to ligate and transect along the mesosalpinx just inferior to the tube. Hemostasis noted at ligation site. The tube was placed into a 11 mm bag and removed from the abdomen. The pelvis was irrigated and suctioned with hemostasis noted at site. The left tube and ovary were inspected and noted to be normal. The abdomen was again inspected and no bleeding or other abnormalities noted. At this point the procedure was completed and the trocars were removed from the abdomen. The pneumoperitoneum was removed as much as possible. The fascia of the 11 mm port was closed using 0-Vicryl. The skin of all ports was closed using 4-0 Monocryl. The uterine manipulator was removed and no bleeding noted at the site of the tenaculum.   The patient was extubated without difficulty and taken to PACU  in stable condition.   The patient will be discharged to home  as per PACU criteria.  Routine postoperative instructions given.  She was prescribed oxycodone & Ibuprofen.  She will follow up in the clinic in 2 weeks for postoperative evaluation.   Baldemar Lenis, MD, [facility] Attending Center for [employer] [facility])

## 2020-04-07 NOTE — MAU Provider Note (Signed)
OB/GYN Pre-Op History and Physical  Wanda Haynes is a 34 y.o. G2P1001 presenting for intermittent abdominal/pelvic pain for several weeks, does not have regular periods and did not know she was pregnant prior to today. Pain worsened to 5/10 today and so she presented to [facility]. Found to have ruptured ectopic pregnancy on [place] at [facility]. She is otherwise feeling well but has not eaten since Tuesday, 12/29.          Past Medical History:  Diagnosis Date  . CIN II (cervical intraepithelial neoplasia II)     History reviewed. No pertinent surgical history.                  OB History  Gravida Para Term Preterm AB Living  2 1 1  0 0 1  SAB IAB Ectopic Multiple Live Births     0 0 0 0 1       # Outcome Date GA Lbr Len/2nd Weight Sex Delivery Anes PTL Lv  2 Current           1 Term 04/26/09 [redacted]w[redacted]d  2892 g F Vag-Spont   LIV    Social History   Socioeconomic History  . Marital status: Single    Spouse name: Not on file  . Number of children: Not on file  . Years of education: Not on file  . Highest education level: Not on file  Occupational History  . Not on file  Tobacco Use  . Smoking status: Current Every Day Smoker    Packs/day: 0.25    Years: 4.00    Pack years: 1.00    Types: Cigarettes  . Smokeless tobacco: Never Used  Vaping Use  . Vaping Use: Never used  Substance and Sexual Activity  . Alcohol use: Yes    Comment: occ  . Drug use: No  . Sexual activity: Yes  Other Topics Concern  . Not on file  Social History Narrative  . Not on file   Social Determinants of Health   Financial Resource Strain: Not on file  Food Insecurity: Not on file  Transportation Needs: Not on file  Physical Activity: Not on file  Stress: Not on file  Social Connections: Not on file    No family history on file.         Medications Prior to Admission  Medication Sig Dispense Refill Last Dose  . clindamycin (CLEOCIN)  300 MG capsule Take 1 capsule (300 mg total) by mouth 4 (four) times daily. X 7 days 28 capsule 0     No Known Allergies  Review of Systems: Negative except for what is mentioned in HPI.     Physical Exam: BP 123/67   Pulse 74   Temp 98.1 F (36.7 C)   Resp 18   Ht 5\' 2"  (1.575 m)   Wt 56.2 kg   LMP 03/17/2020 (Exact Date)   SpO2 100%   BMI 22.68 kg/m  CONSTITUTIONAL: Well-developed, well-nourished female in no acute distress.  HENT:  Normocephalic, atraumatic, External right and left ear normal. Oropharynx is clear and moist EYES: Conjunctivae and EOM are normal. Pupils are equal, round, and reactive to light. No scleral icterus.  NECK: Normal range of motion, supple, no masses SKIN: Skin is warm and dry. No rash noted. Not diaphoretic. No erythema. No pallor. NEUROLGIC: Alert and oriented to person, place, and time. Normal reflexes, muscle tone coordination. No cranial nerve deficit noted. PSYCHIATRIC: Normal mood and affect. Normal behavior. Normal judgment  and thought content. CARDIOVASCULAR: Normal heart rate noted RESPIRATORY: Effort normal, no problems with respiration noted ABDOMEN: Soft, moderately tender, nondistended PELVIC: Deferred MUSCULOSKELETAL: Normal range of motion. No edema and no tenderness. 2+ distal pulses.   Pertinent Labs/Studies:   Lab Results Past 72 Hours        Results for orders placed or performed during the hospital encounter of 04/07/20 (from the past 72 hour(s))  Urinalysis, Routine w reflex microscopic Urine, Clean Catch     Status: Abnormal   Collection Time: 04/07/20  1:19 PM  Result Value Ref Range   Color, Urine YELLOW YELLOW   APPearance CLEAR CLEAR   Specific Gravity, Urine 1.020 1.005 - 1.030   pH 6.5 5.0 - 8.0   Glucose, UA NEGATIVE NEGATIVE mg/dL   Hgb urine dipstick NEGATIVE NEGATIVE   Bilirubin Urine NEGATIVE NEGATIVE   Ketones, ur 80 (A) NEGATIVE mg/dL   Protein, ur NEGATIVE NEGATIVE mg/dL   Nitrite  NEGATIVE NEGATIVE   Leukocytes,Ua NEGATIVE NEGATIVE    Comment: Microscopic not done on urines with negative protein, blood, leukocytes, nitrite, or glucose < 500 mg/dL. Performed at [facility] [place], [address] [place], [place] [address]   Pregnancy, urine     Status: Abnormal   Collection Time: 04/07/20  1:19 PM  Result Value Ref Range   Preg Test, Ur POSITIVE (A) NEGATIVE    Comment:        THE SENSITIVITY OF THIS METHODOLOGY IS >20 mIU/mL. Performed at [facility] [place], [address] [place], [place] [address]   Lipase, blood     Status: None   Collection Time: 04/07/20  1:48 PM  Result Value Ref Range   Lipase 27 11 - 51 U/L    Comment: Performed at [facility] [place], [address] [place], [place] [address]  Comprehensive metabolic panel     Status: Abnormal   Collection Time: 04/07/20  1:48 PM  Result Value Ref Range   Sodium 139 135 - 145 mmol/L   Potassium 4.0 3.5 - 5.1 mmol/L   Chloride 104 98 - 111 mmol/L   CO2 25 22 - 32 mmol/L   Glucose, Bld 102 (H) 70 - 99 mg/dL    Comment: Glucose reference range applies only to samples taken after fasting for at least 8 hours.   BUN 9 6 - 20 mg/dL   Creatinine, Ser 2.130.83 0.44 - 1.00 mg/dL   Calcium 9.5 8.9 - 08.610.3 mg/dL   Total Protein 8.2 (H) 6.5 - 8.1 g/dL   Albumin 4.7 3.5 - 5.0 g/dL   AST 19 15 - 41 U/L   ALT 11 0 - 44 U/L   Alkaline Phosphatase 43 38 - 126 U/L   Total Bilirubin 0.6 0.3 - 1.2 mg/dL   GFR, Estimated >57>60 >84>60 mL/min    Comment: (NOTE) Calculated using the CKD-EPI Creatinine Equation (2021)    Anion gap 10 5 - 15    Comment: Performed at [facility] [place], [address] [place], [place] [address]  CBC     Status: None   Collection Time: 04/07/20  1:48 PM  Result Value Ref Range   WBC 7.4 4.0 - 10.5 K/uL   RBC 4.36 3.87 - 5.11 MIL/uL   Hemoglobin 13.7 12.0 - 15.0 g/dL   HCT 52.840.7 41.336.0 - 24.446.0 %   MCV 93.3 80.0 - 100.0 fL    MCH 31.4 26.0 - 34.0 pg   MCHC 33.7  30.0 - 36.0 g/dL   RDW 71.6 96.7 - 89.3 %   Platelets 218 150 - 400 K/uL   nRBC 0.0 0.0 - 0.2 %    Comment: Performed at [facility], [address] [place], [place] [address]  hCG, quantitative, pregnancy     Status: Abnormal   Collection Time: 04/07/20  1:48 PM  Result Value Ref Range   hCG, Beta Chain, Quant, S 2,649 (H) <5 mIU/mL    Comment:          GEST. AGE      CONC.  (mIU/mL)   <=1 WEEK        5 - 50     2 WEEKS       50 - 500     3 WEEKS       100 - 10,000     4 WEEKS     1,000 - 30,000     5 WEEKS     3,500 - 115,000   6-8 WEEKS     12,000 - 270,000    12 WEEKS     15,000 - 220,000        FEMALE AND NON-PREGNANT FEMALE:     LESS THAN 5 mIU/mL Performed at [facility], [address] [place], [place] [address]   Resp Panel by RT-PCR (Flu A&B, Covid) Nasopharyngeal Swab     Status: Abnormal   Collection Time: 04/07/20  6:14 PM   Specimen: Nasopharyngeal Swab; Nasopharyngeal(NP) swabs in vial transport medium  Result Value Ref Range   SARS Coronavirus 2 by RT PCR POSITIVE (A) NEGATIVE    Comment: RESULT CALLED TO, READ BACK BY AND VERIFIED WITH: SAM COBLE RN AT 1918 ON 04/07/20 BY I.SUGUT (NOTE) SARS-CoV-2 target nucleic acids are DETECTED.  The SARS-CoV-2 RNA is generally detectable in upper respiratory specimens during the acute phase of infection. Positive results are indicative of the presence of the identified virus, but do not rule out bacterial infection or co-infection with other pathogens not detected by the test. Clinical correlation with patient history and other diagnostic information is necessary to determine patient infection status. The expected result is Negative.  Fact Sheet for Patients: BloggerCourse.com  Fact Sheet for Healthcare Providers: SeriousBroker.it  This test is not yet approved or cleared by the [place] FDA and  has been authorized for detection and/or diagnosis of SARS-CoV-2 by FDA under an Emergency Use Authorization (EUA).  This EUA will remain in effect (meaning this t est can be used) for the duration of  the COVID-19 declaration under Section 564(b)(1) of the Act, 21 U.S.C. section 360bbb-3(b)(1), unless the authorization is terminated or revoked sooner.     Influenza A by PCR NEGATIVE NEGATIVE   Influenza B by PCR NEGATIVE NEGATIVE    Comment: (NOTE) The Xpert Xpress SARS-CoV-2/FLU/RSV plus assay is intended as an aid in the diagnosis of influenza from Nasopharyngeal swab specimens and should not be used as a sole basis for treatment. Nasal washings and aspirates are unacceptable for Xpert Xpress SARS-CoV-2/FLU/RSV testing.  Fact Sheet for Patients: BloggerCourse.com  Fact Sheet for Healthcare Providers: SeriousBroker.it  This test is not yet approved or cleared by the [place] FDA and has been authorized for detection and/or diagnosis of SARS-CoV-2 by FDA under an Emergency Use Authorization (EUA). This EUA will remain in effect (meaning this test can be used) for the duration of the COVID-19 declaration under Section 564(b)(1) of the Act, 21 U.S.C.  section 360bbb-3(b)(1), unless the authorization is terminated or revoked.  Performed at [facility], [address] [place], [place] [address]      Narrative & Impression  CLINICAL DATA: Right lower quadrant pain  EXAM: OBSTETRIC <14 WK [place] AND TRANSVAGINAL OB US  TECHNIQUE: Both transabdominal and transvaginal ultrasound examinations were performed for complete evaluation of the gestation as well as the maternal uterus, adnexal regions, and pelvic cul-de-sac. Transvaginal technique was performed to assess early pregnancy.  COMPARISON: None.  FINDINGS: Intrauterine gestational sac: None  Yolk sac: Visualized in the  right adnexa.  Embryo: Visualized within the right adnexa.  Cardiac Activity: Not Visualized.  CRL: 13.6 mm 7 w 4 d [place] EDC: 11/20/2020  Subchorionic hemorrhage: None visualized.  Maternal uterus/adnexae: Left ovary is within normal limits and measures 3.3 x 1.5 x 1.9 cm. The right ovary measures 2.9 x 1.8 x 1.5 cm. Heterogenous right adnexal mass measuring 8.1 x 3.1 x 3.8 cm. Within the right adnexa, medial and superior to the right ovary is an apparent ectopic pregnancy with visible gestational sac, embryo, and yolk sac. The 8.1 cm heterogenous adnexal mass is visualized between the ectopic pregnancy and the ovary and is concerning for tubal hemorrhage. There is moderate complex fluid in the pelvis.  IMPRESSION: 1. Findings highly suspicious for ruptured right ectopic pregnancy. Fetal pole identified in the right adnexa but no fetal cardiac activity. Large heterogenous right adnexal mass suspicious for tubal hemorrhage with moderate complex free fluid in the pelvis. No IUP identified.  Critical Value/emergent results were called by telephone at the time of interpretation on 04/07/2020 at 6:11 pm to provider [facility] , who verbally acknowledged these results.   Electronically Signed By: Jasmine Pang M.D. On: 04/07/2020 18:12        Assessment and Plan :MADHAVI HAMBLEN is a 34 y.o. G2P1001 transferred here from [facility] for suspected ruptured ectopic pregnancy on ultrasound. She appears stable, minimal pain with just sitting and moderate tenderness with palpation.   Reviewed US findings, recommendation for surgical management for suspected ruptured ectopic pregnancy. Reviewed that surgery will likely involve removing fallopian tube, reviewed that she will be able to achieve pregnancy with one tube. Reviewed will only remove/repair structures as necessary. She verbalizes understanding of the above.  The risks of  laparoscopic surgery were discussed with the patient including but not limited to: bleeding which may require transfusion or reoperation; infection which may require antibiotics; injury to bowel, bladder, ureters or other surrounding organs; need for additional procedures including laparotomy; thromboembolic phenomenon, incisional problems and other postoperative/anesthesia complications. Patient verbalized understanding of the above and consent signed. Answered all questions.  She is agreeable to blood transfusion in the event of emergency.   Main OR aware, they will call me back with time for surgery, pt VS stable Of note, she is COVID positive and asymptomatic Plan for laparoscopic removal of ectopic pregnancy NPO Type & Screen    Kirtland Bouchard Therese Sarah, M.D. Attending Obstetrician & Gynecologist, [facility] for [employer], [facility]

## 2020-04-07 NOTE — ED Triage Notes (Signed)
Pt reports RLQ pain radiating to her back x 1 week

## 2020-04-07 NOTE — Anesthesia Procedure Notes (Signed)
Procedure Name: Intubation Date/Time: 04/07/2020 8:56 PM Performed by: Oletta Lamas, CRNA Pre-anesthesia Checklist: Patient identified, Emergency Drugs available, Suction available and Patient being monitored Patient Re-evaluated:Patient Re-evaluated prior to induction Oxygen Delivery Method: Circle System Utilized Preoxygenation: Pre-oxygenation with 100% oxygen Induction Type: IV induction, Rapid sequence and Cricoid Pressure applied Laryngoscope Size: Mac and 3 Grade View: Grade I Tube type: Oral Tube size: 7.0 mm Number of attempts: 1 Airway Equipment and Method: Stylet and Oral airway Placement Confirmation: ETT inserted through vocal cords under direct vision,  positive ETCO2 and breath sounds checked- equal and bilateral Secured at: 21 cm Tube secured with: Tape Dental Injury: Teeth and Oropharynx as per pre-operative assessment

## 2020-04-07 NOTE — ED Notes (Signed)
Patient transported to Ultrasound 

## 2020-04-07 NOTE — ED Provider Notes (Signed)
[facility] EMERGENCY DEPARTMENT Provider Note   CSN: [id number] Arrival date & time: 04/07/20  1243     History No chief complaint on file.   Sarita BottomBrittany N Blase is a 34 y.o. female.  HPI      Sarita BottomBrittany N Giuliani is a 34 y.o. female, with a history of G2P1, presenting to the ED with abdominal pain worsening since December 28. Pain is in the right lower quadrant, waxing and waning, worse with ambulation and palpation, stabbing, currently 5/10, radiating to the back.  Last normal menstrual cycle was October 24.  She had 1 day of brief vaginal bleeding around November 24.  Vaginal bleeding from December 10-14 and 22-24.  She had brief right lower quadrant pain December 24 and then no pain until it recurred December 28.  Accompanied by nausea, constipation, decreased appetite. Last food two days ago. States she sees the health department for OB/GYN.  Denies history of intra-abdominal surgeries. Declines analgesia during ED course. No COVID vaccination. Denies fever/chills, chest pain, shortness of breath, diarrhea, hematochezia/melena, urinary symptoms, dizziness, syncope, or any other complaints.   Past Medical History:  Diagnosis Date  . CIN II (cervical intraepithelial neoplasia II)     There are no problems to display for this patient.   History reviewed. No pertinent surgical history.   OB History    Gravida  2   Para  1   Term  1   Preterm  0   AB  0   Living  1     SAB  0   IAB  0   Ectopic  0   Multiple  0   Live Births  1           No family history on file.  Social History   Tobacco Use  . Smoking status: Current Every Day Smoker    Packs/day: 0.25    Years: 4.00    Pack years: 1.00    Types: Cigarettes  . Smokeless tobacco: Never Used  Vaping Use  . Vaping Use: Never used  Substance Use Topics  . Alcohol use: Yes    Comment: occ  . Drug use: No    Home Medications Prior to Admission medications   Medication Sig  Start Date End Date Taking? Authorizing Provider  clindamycin (CLEOCIN) 300 MG capsule Take 1 capsule (300 mg total) by mouth 4 (four) times daily. X 7 days 02/25/19   Rolan BuccoBelfi, Melanie, MD    Allergies    Patient has no known allergies.  Review of Systems   Review of Systems  Constitutional: Negative for chills, diaphoresis and fever.  Respiratory: Negative for cough and shortness of breath.   Cardiovascular: Negative for chest pain.  Gastrointestinal: Positive for abdominal pain, constipation and nausea. Negative for blood in stool, diarrhea and vomiting.  Genitourinary: Negative for difficulty urinating, dysuria, vaginal bleeding (None in the last few days) and vaginal discharge.  Neurological: Negative for dizziness, syncope and weakness.  All other systems reviewed and are negative.   Physical Exam Updated Vital Signs BP (!) 147/94 (BP Location: Right Arm)   Pulse 76   Temp 98.3 F (36.8 C) (Oral)   Resp 20   Ht 5\' 2"  (1.575 m)   Wt 56.2 kg   LMP 03/17/2020   SpO2 100%   BMI 22.68 kg/m   Physical Exam Vitals and nursing note reviewed.  Constitutional:      General: She is not in acute distress.    Appearance:  She is well-developed. She is not diaphoretic.  HENT:     Head: Normocephalic and atraumatic.     Mouth/Throat:     Mouth: Mucous membranes are moist.     Pharynx: Oropharynx is clear.  Eyes:     Conjunctiva/sclera: Conjunctivae normal.  Cardiovascular:     Rate and Rhythm: Normal rate and regular rhythm.     Pulses: Normal pulses.          Radial pulses are 2+ on the right side and 2+ on the left side.     Heart sounds: Normal heart sounds.  Pulmonary:     Effort: Pulmonary effort is normal. No respiratory distress.     Breath sounds: Normal breath sounds.  Abdominal:     Palpations: Abdomen is soft.     Tenderness: There is abdominal tenderness in the right lower quadrant. There is no guarding.  Musculoskeletal:     Cervical back: Neck supple.   Skin:    General: Skin is warm and dry.  Neurological:     Mental Status: She is alert.  Psychiatric:        Mood and Affect: Mood and affect normal.        Speech: Speech normal.        Behavior: Behavior normal.     ED Results / Procedures / Treatments   Labs (all labs ordered are listed, but only abnormal results are displayed) Labs Reviewed  COMPREHENSIVE METABOLIC PANEL - Abnormal; Notable for the following components:      Result Value   Glucose, Bld 102 (*)    Total Protein 8.2 (*)    All other components within normal limits  URINALYSIS, ROUTINE W REFLEX MICROSCOPIC - Abnormal; Notable for the following components:   Ketones, ur 80 (*)    All other components within normal limits  PREGNANCY, URINE - Abnormal; Notable for the following components:   Preg Test, Ur POSITIVE (*)    All other components within normal limits  RESP PANEL BY RT-PCR (FLU A&B, COVID) ARPGX2  LIPASE, BLOOD  CBC  HCG, QUANTITATIVE, PREGNANCY  ABO/RH    EKG None  Radiology US OB LESS THAN 14 WEEKS WITH OB TRANSVAGINAL  Result Date: 04/07/2020 CLINICAL DATA:  Right lower quadrant pain EXAM: OBSTETRIC <14 WK [place] AND TRANSVAGINAL OB US TECHNIQUE: Both transabdominal and transvaginal ultrasound examinations were performed for complete evaluation of the gestation as well as the maternal uterus, adnexal regions, and pelvic cul-de-sac. Transvaginal technique was performed to assess early pregnancy. COMPARISON:  None. FINDINGS: Intrauterine gestational sac: None Yolk sac:  Visualized in the right adnexa. Embryo:  Visualized within the right adnexa. Cardiac Activity: Not Visualized. CRL: 13.6 mm   7 w   4 d                  [place] EDC: 11/20/2020 Subchorionic hemorrhage:  None visualized. Maternal uterus/adnexae: Left ovary is within normal limits and measures 3.3 x 1.5 x 1.9 cm. The right ovary measures 2.9 x 1.8 x 1.5 cm. Heterogenous right adnexal mass measuring 8.1 x 3.1 x 3.8 cm. Within the right adnexa,  medial and superior to the right ovary is an apparent ectopic pregnancy with visible gestational sac, embryo, and yolk sac. The 8.1 cm heterogenous adnexal mass is visualized between the ectopic pregnancy and the ovary and is concerning for tubal hemorrhage. There is moderate complex fluid in the pelvis. IMPRESSION: 1. Findings highly suspicious for ruptured right ectopic pregnancy. Fetal pole identified in the  right adnexa but no fetal cardiac activity. Large heterogenous right adnexal mass suspicious for tubal hemorrhage with moderate complex free fluid in the pelvis. No IUP identified. Critical Value/emergent results were called by telephone at the time of interpretation on 04/07/2020 at 6:11 pm to provider [facility] , who verbally acknowledged these results. Electronically Signed   By: Jasmine Pang M.D.   On: 04/07/2020 18:12    Procedures .Critical Care Performed by: Anselm Pancoast, PA-C Authorized by: Anselm Pancoast, PA-C   Critical care provider statement:    Critical care time (minutes):  35   Critical care time was exclusive of:  Separately billable procedures and treating other patients   Critical care was necessary to treat or prevent imminent or life-threatening deterioration of the following conditions: Surgical emergency; Ruptured ectopic pregnancy.   Critical care was time spent personally by me on the following activities:  Ordering and performing treatments and interventions, ordering and review of laboratory studies, ordering and review of radiographic studies, re-evaluation of patient's condition, review of old charts, obtaining history from patient or surrogate, development of treatment plan with patient or surrogate, discussions with consultants, evaluation of patient's response to treatment and examination of patient   I assumed direction of critical care for this patient from another provider in my specialty: no     Care discussed with: admitting provider and accepting provider at  another facility     (including critical care time)  Medications Ordered in ED Medications  lactated ringers bolus 1,000 mL (1,000 mLs Intravenous New Bag/Given 04/07/20 1811)    ED Course  I have reviewed the triage vital signs and the nursing notes.  Pertinent labs & imaging results that were available during my care of the patient were reviewed by me and considered in my medical decision making (see chart for details).  Clinical Course as of 04/07/20 1909  Caleen Essex Apr 07, 2020  1813 Spoke with Dr. Earlene Plater, OBGYN.  We discussed the patient's symptoms, pregnancy status, and US findings.  She states patient will need to be transferred to MAU within the next hour at the most. She will then be going to OR. They will obtain type and screen upon her arrival in MAU. Other than keeping patient NPO and obtaining COVID swab, nothing else needs to be done here in ED. [SJ]    Clinical Course User Index [SJ] Brenya Taulbee C, PA-C   MDM Rules/Calculators/A&P                          Patient presents with right lower quadrant abdominal pain for the past couple days.  No vaginal bleeding. Nontoxic-appearing, afebrile, not tachycardic, not hypotensive. I personally reviewed and interpreted the available labs and imaging studies.  Ultrasound concerning for ruptured ectopic pregnancy. Patient transferred to [facility] to facilitate emergent surgical intervention.  Findings and plan of care discussed with attending physician, Sherin Quarry, MD.   Vitals:   04/07/20 1812 04/07/20 1815 04/07/20 1830 04/07/20 1850  BP: (!) 141/99 (!) 156/122 (!) 148/71 (!) 133/94  Pulse: 74 73 78 61  Resp:    16  Temp:      TempSrc:      SpO2: 99% 100% 94% 100%  Weight:      Height:        Final Clinical Impression(s) / ED Diagnoses Final diagnoses:  Ruptured ectopic pregnancy    Rx / DC Orders ED Discharge Orders    None  Concepcion Living 04/07/20 1911    Cheryll Cockayne, MD 04/08/20  [phone]

## 2020-04-08 ENCOUNTER — Encounter (HOSPITAL_COMMUNITY): Payer: Self-pay | Admitting: Obstetrics and Gynecology

## 2020-04-08 MED ORDER — IBUPROFEN 600 MG PO TABS: 600.0000 mg | ORAL_TABLET | Freq: Four times a day (QID) | ORAL | 2 refills | Status: AC | PRN

## 2020-04-08 MED ORDER — OXYCODONE HCL 5 MG PO TABS: 5.0000 mg | ORAL_TABLET | ORAL | 0 refills | Status: DC | PRN

## 2020-04-11 LAB — SURGICAL PATHOLOGY

## 2020-04-24 ENCOUNTER — Ambulatory Visit: Payer: Medicaid Other | Admitting: Family Medicine

## 2020-05-01 ENCOUNTER — Encounter: Payer: Self-pay | Admitting: Obstetrics and Gynecology

## 2020-05-01 ENCOUNTER — Telehealth (INDEPENDENT_AMBULATORY_CARE_PROVIDER_SITE_OTHER): Payer: Self-pay | Admitting: Obstetrics and Gynecology

## 2020-05-01 DIAGNOSIS — Z9889 Other specified postprocedural states: Secondary | ICD-10-CM

## 2020-05-01 DIAGNOSIS — O009 Unspecified ectopic pregnancy without intrauterine pregnancy: Secondary | ICD-10-CM

## 2020-05-01 DIAGNOSIS — Z5189 Encounter for other specified aftercare: Secondary | ICD-10-CM

## 2020-05-01 NOTE — Progress Notes (Signed)
I connected with  Wanda Haynes on 05/01/20 at  8:15 AM EST by telephone and verified that I am speaking with the correct person using two identifiers.   I discussed the limitations, risks, security and privacy concerns of performing an evaluation and management service by telephone and the availability of in person appointments. I also discussed with the patient that there may be a patient responsible charge related to this service. The patient expressed understanding and agreed to proceed.  [name], RN 05/01/2020  8:17 AM

## 2020-05-01 NOTE — Progress Notes (Addendum)
GYNECOLOGY TELEPHONE ENCOUNTER NOTE  Provider location: [facility] for [employer] [facility] [facility]   I connected with Wanda Haynes on 05/01/20 at  8:15 AM EST by MyChart Video Encounter at home and verified that I am speaking with the correct person using two identifiers.   I discussed the limitations, risks, security and privacy concerns of performing an evaluation and management service virtually and the availability of in person appointments. I also discussed with the patient that there may be a patient responsible charge related to this service. The patient expressed understanding and agreed to proceed.   History:  Wanda Haynes is a 35 y.o.  G2P1001 here today for follow up for laparoscopic right salpingectomy with evacuation of hemoperitoneum and removal of ectopic pregnancy done on 04/07/20 for ruptured ectopic pregnancy. She is doing well. Minimal pain after procedure that has now resolved. No bleeding. Appetite back to normal, no issues with bathroom use. Overall, feeling well.    Reports her incisions have healed well and she is not having any issues with them.    Past Medical History:  Diagnosis Date  . CIN II (cervical intraepithelial neoplasia II)    Past Surgical History:  Procedure Laterality Date  . DIAGNOSTIC LAPAROSCOPY WITH REMOVAL OF ECTOPIC PREGNANCY N/A 04/07/2020   Procedure: DIAGNOSTIC LAPAROSCOPY WITH REMOVAL OF ECTOPIC PREGNANCY;  Surgeon: Conan Bowens, MD;  Location: [facility] OR;  Service: Gynecology;  Laterality: N/A;  . LAPAROSCOPIC UNILATERAL SALPINGECTOMY N/A 04/07/2020   Procedure: LAPAROSCOPIC RIGHT SALPINGECTOMY;  Surgeon: Conan Bowens, MD;  Location: [facility] OR;  Service: Gynecology;  Laterality: N/A;   The following portions of the patient's history were reviewed and updated as appropriate: allergies, current medications, past family history, past medical history, past social history, past surgical history and problem list.   Review  of Systems:  Pertinent items noted in HPI and remainder of comprehensive ROS otherwise negative.  Physical Exam:   General:  Alert, oriented and cooperative. Patient appears to be in no acute distress.  Mental Status: Normal mood and affect. Normal behavior. Normal judgment and thought content.      Rest of physical exam deferred due to type of encounter  Labs and Imaging No results found for this or any previous visit (from the past 336 hour(s)). US OB LESS THAN 14 WEEKS WITH OB TRANSVAGINAL  Result Date: 04/07/2020 CLINICAL DATA:  Right lower quadrant pain EXAM: OBSTETRIC <14 WK [place] AND TRANSVAGINAL OB US TECHNIQUE: Both transabdominal and transvaginal ultrasound examinations were performed for complete evaluation of the gestation as well as the maternal uterus, adnexal regions, and pelvic cul-de-sac. Transvaginal technique was performed to assess early pregnancy. COMPARISON:  None. FINDINGS: Intrauterine gestational sac: None Yolk sac:  Visualized in the right adnexa. Embryo:  Visualized within the right adnexa. Cardiac Activity: Not Visualized. CRL: 13.6 mm   7 w   4 d                  [place] EDC: 11/20/2020 Subchorionic hemorrhage:  None visualized. Maternal uterus/adnexae: Left ovary is within normal limits and measures 3.3 x 1.5 x 1.9 cm. The right ovary measures 2.9 x 1.8 x 1.5 cm. Heterogenous right adnexal mass measuring 8.1 x 3.1 x 3.8 cm. Within the right adnexa, medial and superior to the right ovary is an apparent ectopic pregnancy with visible gestational sac, embryo, and yolk sac. The 8.1 cm heterogenous adnexal mass is visualized between the ectopic pregnancy and the ovary and is  concerning for tubal hemorrhage. There is moderate complex fluid in the pelvis. IMPRESSION: 1. Findings highly suspicious for ruptured right ectopic pregnancy. Fetal pole identified in the right adnexa but no fetal cardiac activity. Large heterogenous right adnexal mass suspicious for tubal hemorrhage with  moderate complex free fluid in the pelvis. No IUP identified. Critical Value/emergent results were called by telephone at the time of interpretation on 04/07/2020 at 6:11 pm to provider [facility] , who verbally acknowledged these results. Electronically Signed   By: Jasmine Pang M.D.   On: 04/07/2020 18:12       SURGICAL PATHOLOGY  CASE: [id number]  PATIENT: Landry Mellow  Surgical Pathology Report   Clinical History: Ruptured ectopic pregnancy (ms)   FINAL MICROSCOPIC DIAGNOSIS:   A. FALLOPIAN TUBE, RIGHT, ECTOPIC PREGNANCY:  - Dilated and focally disrupted fallopian tube with chorionic villi and  embryonal parts, consistent with clinically stated ectopic pregnancy   Assessment and Plan:   1. Postoperative state Doing well, no issues  2. Ruptured ectopic pregnancy Reviewed surgery and path, patient verbalizes understanding  She will f/u with [facility] for pap smear due to insurance   I discussed the assessment and treatment plan with the patient. The patient was provided an opportunity to ask questions and all were answered. The patient agreed with the plan and demonstrated an understanding of the instructions.   The patient was advised to call back or seek an in-person evaluation/go to the ED if the symptoms worsen or if the condition fails to improve as anticipated.  I provided 15 minutes of face-to-face time during this encounter.   Conan Bowens, MD Center for [facility], [facility]

## 2021-12-27 IMAGING — US US OB < 14 WEEKS - US OB TV
1 series · 13 of 28 positions shown · non-contrast
Comparison: None.

CLINICAL DATA: Right lower quadrant pain

EXAM:
OBSTETRIC <14 WK US AND TRANSVAGINAL OB US
TECHNIQUE: Both transabdominal and transvaginal ultrasound examinations were
performed for complete evaluation of the gestation as well as the
maternal uterus, adnexal regions, and pelvic cul-de-sac.
Transvaginal technique was performed to assess early pregnancy.

[Series 1: us ob < 14 weeks - us ob tv · 151 acquisitions, 13 frames shown]
[im 6/151]
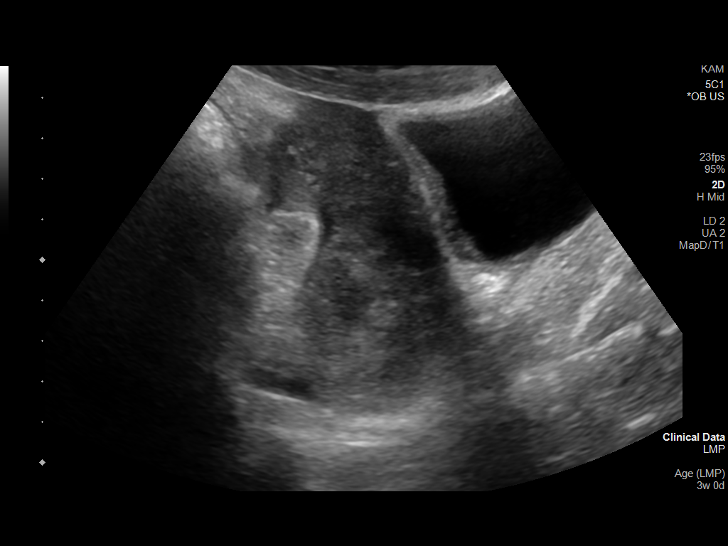
[im 17/151]
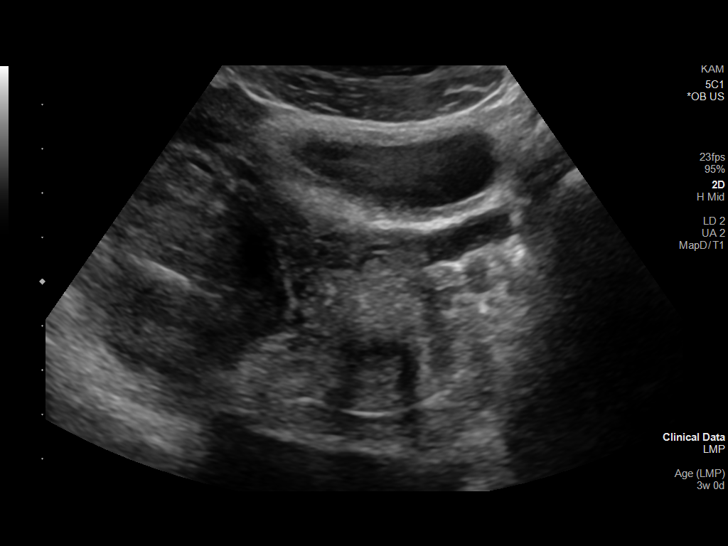
[im 28/151]
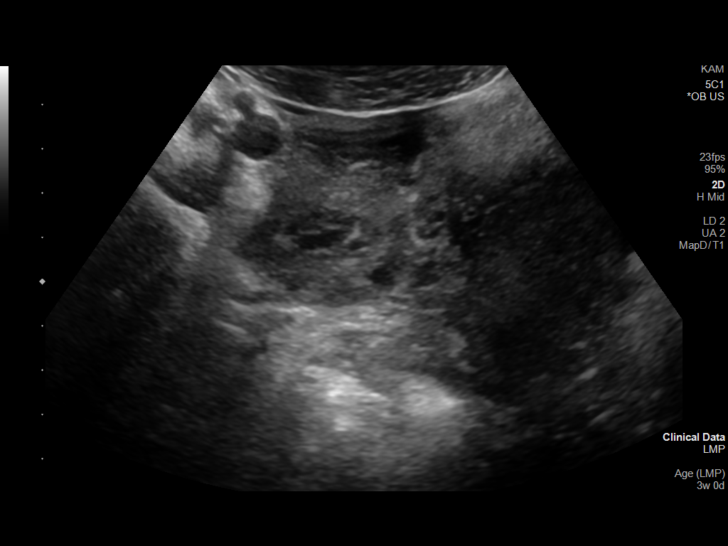
[im 39/151]
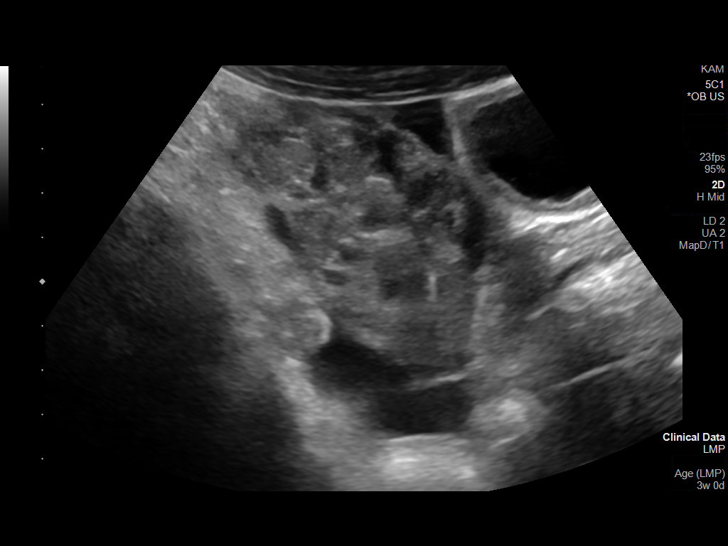
[im 51/151]
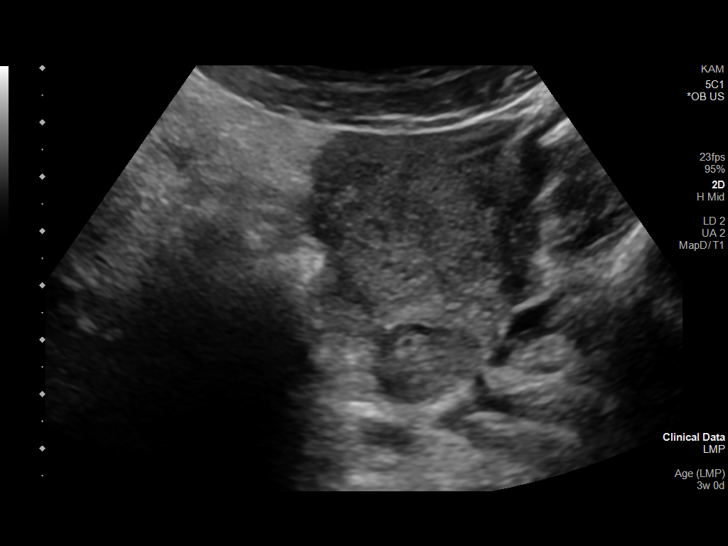
[im 62/151]
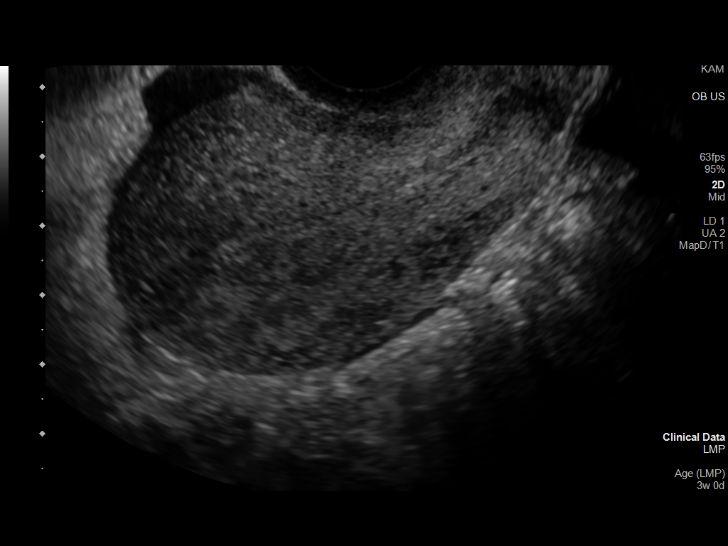
[im 78/151]
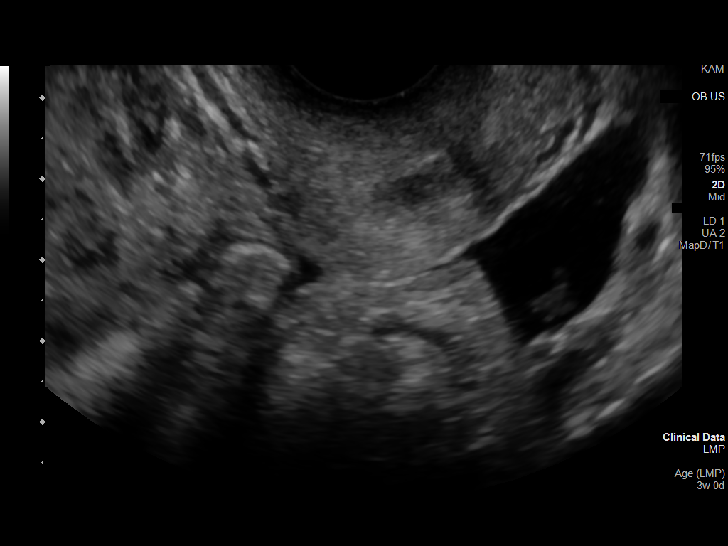
[im 89/151]
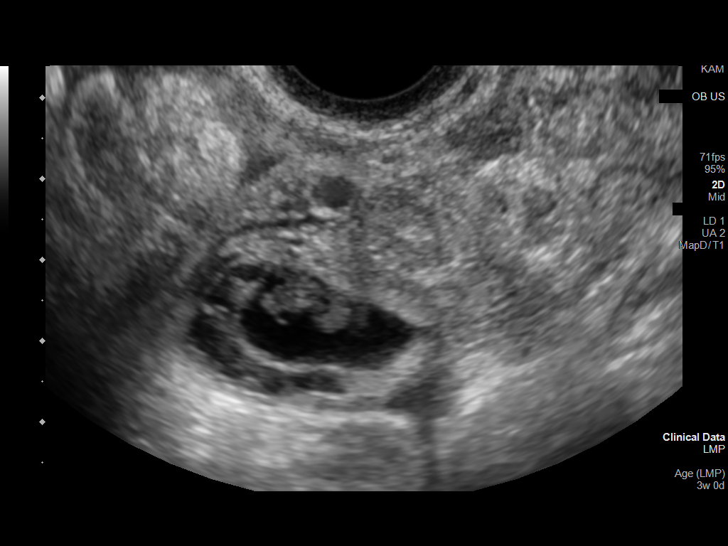
[im 101/151]
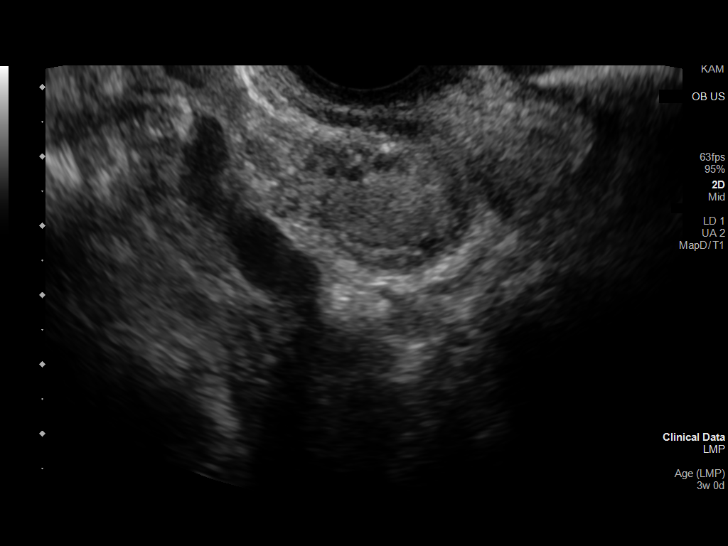
[im 112/151]
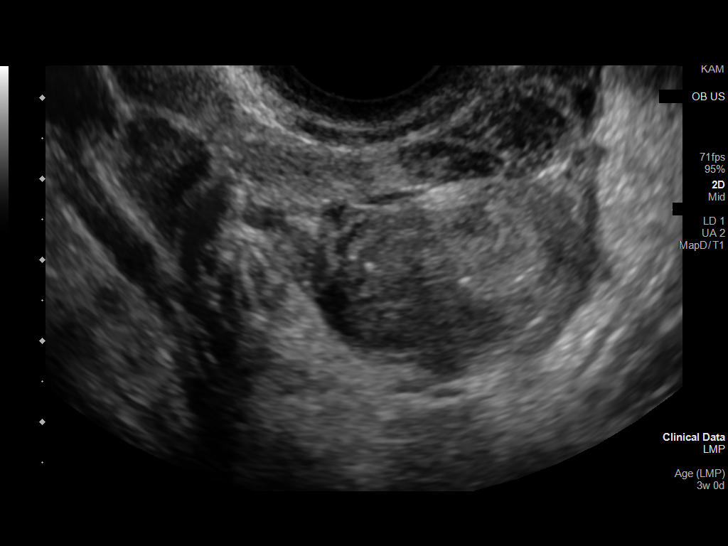
[im 123/151]
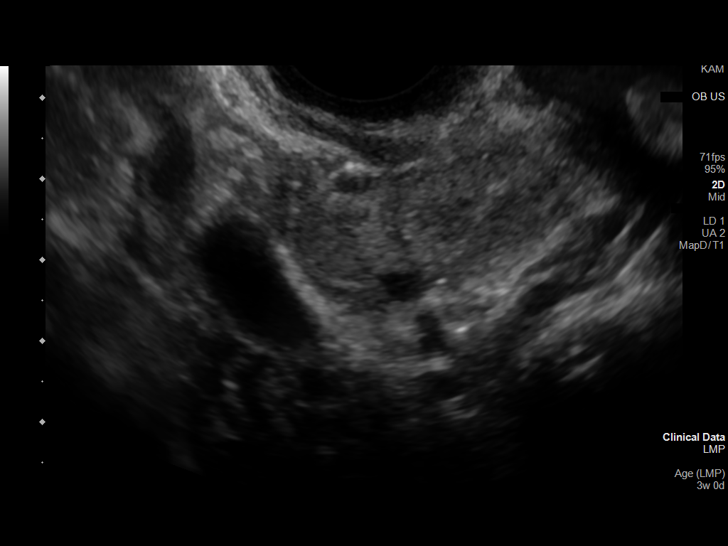
[im 134/151]
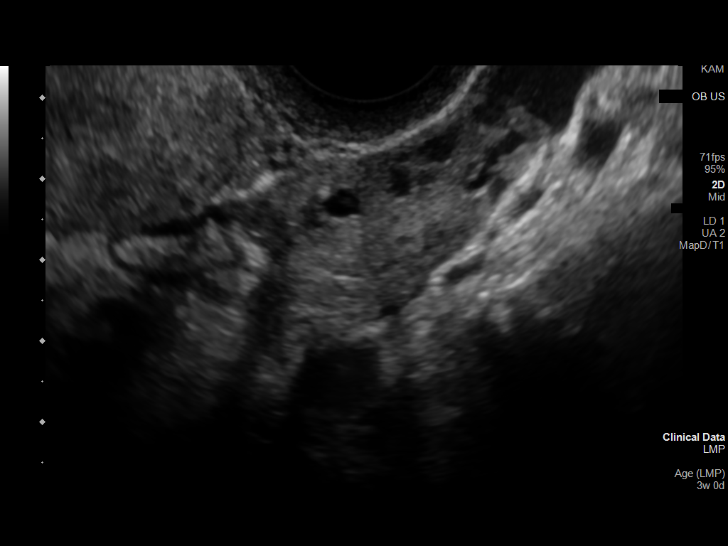
[im 145/151]
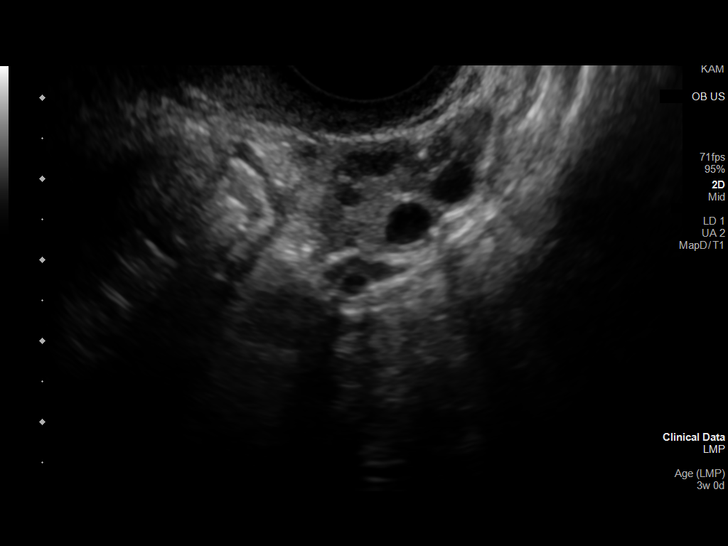

[13 of 28 positions shown; findings below may reference images not displayed]

FINDINGS: Intrauterine gestational sac: None

Yolk sac:  Visualized in the right adnexa.

Embryo:  Visualized within the right adnexa.

Cardiac Activity: Not Visualized.

CRL: 13.6 mm   7 w   4 d                  US EDC: 11/20/2020

Subchorionic hemorrhage:  None visualized.

Maternal uterus/adnexae: Left ovary is within normal limits and
measures 3.3 x 1.5 x 1.9 cm. The right ovary measures 2.9 x 1.8 x
1.5 cm. Heterogenous right adnexal mass measuring 8.1 x 3.1 x
cm. Within the right adnexa, medial and superior to the right ovary
is an apparent ectopic pregnancy with visible gestational sac,
embryo, and yolk sac. The 8.1 cm heterogenous adnexal mass is
visualized between the ectopic pregnancy and the ovary and is
concerning for tubal hemorrhage. There is moderate complex fluid in
the pelvis.
IMPRESSION: 1. Findings highly suspicious for ruptured right ectopic pregnancy.
Fetal pole identified in the right adnexa but no fetal cardiac
activity. Large heterogenous right adnexal mass suspicious for tubal
hemorrhage with moderate complex free fluid in the pelvis. No IUP
identified.

Critical Value/emergent results were called by telephone at the time
of interpretation on 04/07/2020 at [DATE] to provider CRESPOSANTA CAPARRA ,
who verbally acknowledged these results.

## 2022-03-03 ENCOUNTER — Other Ambulatory Visit: Payer: Self-pay

## 2022-03-03 ENCOUNTER — Encounter (HOSPITAL_BASED_OUTPATIENT_CLINIC_OR_DEPARTMENT_OTHER): Payer: Self-pay | Admitting: Emergency Medicine

## 2022-03-03 ENCOUNTER — Emergency Department (HOSPITAL_BASED_OUTPATIENT_CLINIC_OR_DEPARTMENT_OTHER)
Admission: EM | Admit: 2022-03-03 | Discharge: 2022-03-03 | Disposition: A | Discharge status: Home / Self Care | Payer: Medicaid Other | Attending: Emergency Medicine | Admitting: Emergency Medicine

## 2022-03-03 DIAGNOSIS — L259 Unspecified contact dermatitis, unspecified cause: Secondary | ICD-10-CM | POA: Insufficient documentation

## 2022-03-03 DIAGNOSIS — L249 Irritant contact dermatitis, unspecified cause: Secondary | ICD-10-CM

## 2022-03-03 DIAGNOSIS — R21 Rash and other nonspecific skin eruption: Secondary | ICD-10-CM | POA: Diagnosis present

## 2022-03-03 MED ORDER — PREDNISONE 20 MG PO TABS: 40.0000 mg | ORAL_TABLET | Freq: Every day | ORAL | 0 refills | Status: AC

## 2022-03-03 MED ORDER — PREDNISONE 50 MG PO TABS
60.0000 mg | ORAL_TABLET | Freq: Once | ORAL | Status: AC
  Administered 2022-03-03: 60 mg via ORAL
  Filled 2022-03-03: qty 1

## 2022-03-03 NOTE — ED Triage Notes (Signed)
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 Patient has rash to bilateral arms, hands, upper chest and neck area x 4 days. Pt tried benadryl without relief.

## 2022-03-03 NOTE — Discharge Instructions (Addendum)
You were seen in the emergency department for your rash.  It is likely from contacting substances irritating your skin.  Please continue the topical treatments that you have been taking along with the prednisone we have prescribed you.  You may also try other over-the-counter antihistamines for this such as zyrtec.  Follow-up with a dermatologist as soon as possible regarding your symptoms and your primary doctor in 2 to 3 days.

## 2022-03-03 NOTE — ED Notes (Signed)
Pt discharged by provider. Provider reports pt had no questions.

## 2022-03-03 NOTE — ED Provider Notes (Signed)
[facility] EMERGENCY DEPARTMENT Provider Note   CSN: [id number] Arrival date & time: 03/03/22  [id number]     History  Chief Complaint  Patient presents with   Rash    Wanda Haynes is a 36 y.o. female.  36 year old female who presents to the emergency department with rash.  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  Says that she initially was not wearing sleeves and was later told by someone that she needed to.  4 days ago started noticing a rash on her forearms and hands.  Also was on her ear but has resolved.  Is concerned that she may be having a hypersensitivity reaction to material that she is working with.  No recent STIs.  No vaginal lesions or discharge.  Has been trying Benadryl, hydrocortisone topically, and other topical creams after seeing a work nurse who recommended that but the itching and pain has gotten worse so she came into the emergency department for evaluation.  Contacts with similar symptoms.  No poison ivy exposure.       Home Medications Prior to Admission medications   Medication Sig Start Date End Date Taking? Authorizing Provider  predniSONE (DELTASONE) 20 MG tablet Take 2 tablets (40 mg total) by mouth daily for 5 days. 03/03/22 03/08/22 Yes Fransico Meadow, MD  ibuprofen (ADVIL) 600 MG tablet Take 1 tablet (600 mg total) by mouth every 6 (six) hours as needed for headache, mild pain, moderate pain or cramping. Patient not taking: Reported on 05/01/2020 04/08/20   Sloan Leiter, MD      Allergies    Patient has no known allergies.    Review of Systems   Review of Systems  Physical Exam Updated Vital Signs BP (!) 128/102 (BP Location: Left Arm)   Pulse 91   Temp 98.4 F (36.9 C) (Oral)   Resp 15   Ht 5\' 2"  (1.575 m)   Wt 61.2 kg   LMP 02/22/2022   SpO2 100%   Breastfeeding No   BMI 24.69 kg/m  Physical Exam Vitals and nursing note reviewed.   Constitutional:      General: She is not in acute distress.    Appearance: She is well-developed.  HENT:     Head: Normocephalic and atraumatic.     Comments: Healed lesions on right ear    Right Ear: External ear normal.     Left Ear: External ear normal.     Nose: Nose normal.  Eyes:     Extraocular Movements: Extraocular movements intact.     Conjunctiva/sclera: Conjunctivae normal.     Pupils: Pupils are equal, round, and reactive to light.  Pulmonary:     Effort: Pulmonary effort is normal. No respiratory distress.  Musculoskeletal:        General: No swelling.     Cervical back: Normal range of motion and neck supple.     Right lower leg: No edema.     Left lower leg: No edema.  Skin:    General: Skin is warm and dry.     Capillary Refill: Capillary refill takes less than 2 seconds.     Comments: Maculopapular rash with scattered small vesicles on palms, dorsum of hands, bilateral forearms, elbows.  Not on trunk, lower extremities, chest, or back.  Not noted on head.  Neurological:     Mental Status: She is alert and oriented to person, place, and  time. Mental status is at baseline.  Psychiatric:        Mood and Affect: Mood normal.          ED Results / Procedures / Treatments   Labs (all labs ordered are listed, but only abnormal results are displayed) Labs Reviewed - No data to display  EKG None  Radiology No results found.  Procedures Procedures   Medications Ordered in ED Medications  predniSONE (DELTASONE) tablet 60 mg (60 mg Oral Given 03/03/22 0941)    ED Course/ Medical Decision Making/ A&P                           Medical Decision Making Risk Prescription drug management.   Wanda Haynes is a 36 y.o. female who presents with chief complaint of rash.  Initial Ddx:  Contact dermatitis, infectious dermatitis, cellulitis, infectious (zoster/monkey pox)  MDM:  The patient is likely having a contact dermatitis given the location  of her rash.  Appears to only be on exposed the arms and forearms at this time. May be 2/2 one of the substances she has been exposed to at work. Doesn't appear to be consistent with insects and no exposure to poison ivy recently. If was from infectious cause such as zoster would expect it to be more diffuse. No superimposed cellulitis as this time.   Plan:  Prednisone - since has failed topical treatments Zyrtec Dermatology fu  This patient presents to the ED for concern of complaints listed in HPI, this involves an extensive number of treatment options, and is a complaint that carries with it a high risk of complications and morbidity. Disposition including potential need for admission considered.   Dispo: DC Home. Return precautions discussed including, but not limited to, those listed in the AVS. Allowed pt time to ask questions which were answered fully prior to dc.  Records reviewed Outpatient Clinic Notes I have reviewed the patients home medications and made adjustments as needed  Final Clinical Impression(s) / ED Diagnoses Final diagnoses:  Irritant contact dermatitis, unspecified trigger    Rx / DC Orders ED Discharge Orders          Ordered    predniSONE (DELTASONE) 20 MG tablet  Daily        03/03/22 0937              Rondel Baton, MD 03/04/22 1450
